# Patient Record
Sex: Female | Born: 1995 | Race: Black or African American | Hispanic: No | Marital: Single | Smoking: Never smoker
Health system: Southern US, Community
[De-identification: ages and names within clinical notes are randomized; demographics above are authoritative.]

## PROBLEM LIST (undated history)

## (undated) DIAGNOSIS — A599 Trichomoniasis, unspecified: Secondary | ICD-10-CM

## (undated) DIAGNOSIS — F419 Anxiety disorder, unspecified: Secondary | ICD-10-CM

## (undated) DIAGNOSIS — F32A Depression, unspecified: Secondary | ICD-10-CM

## (undated) HISTORY — PX: DILATION AND CURETTAGE OF UTERUS: SHX78

## (undated) HISTORY — PX: WISDOM TOOTH EXTRACTION: SHX21

---

## 2019-10-24 ENCOUNTER — Ambulatory Visit
Admission: EM | Admit: 2019-10-24 | Discharge: 2019-10-24 | Disposition: A | Discharge status: Home / Self Care | Payer: 59 | Attending: Physician Assistant | Admitting: Physician Assistant

## 2019-10-24 ENCOUNTER — Other Ambulatory Visit: Payer: Self-pay

## 2019-10-24 DIAGNOSIS — N760 Acute vaginitis: Secondary | ICD-10-CM

## 2019-10-24 DIAGNOSIS — B9689 Other specified bacterial agents as the cause of diseases classified elsewhere: Secondary | ICD-10-CM | POA: Diagnosis not present

## 2019-10-24 MED ORDER — FLUCONAZOLE 150 MG PO TABS: 150.0000 mg | ORAL_TABLET | Freq: Every day | ORAL | 0 refills | Status: DC

## 2019-10-24 NOTE — Discharge Instructions (Signed)
You were treated empirically for yeast. Start diflucan as directed.  Cytology sent, you will be contacted with any positive results that requires further treatment. Refrain from sexual activity and alcohol use for the next 7 days.   Female: Monitor for any worsening of symptoms, fever, abdominal pain, nausea, vomiting, to follow up for reevaluation.

## 2019-10-24 NOTE — ED Provider Notes (Signed)
[facility]    CSN: [id number] Arrival date & time: 10/24/19  1308      History   Chief Complaint Chief Complaint  Patient presents with  . Vaginal Discharge    HPI Catherine Becker is a 24 y.o. female.   24 year old female comes in for 2 day history of vaginal discharge. States discharge is white and thick without odor. Denies vaginal itching. Denies frequency, dysuria, hematuria. Denies abdominal pain, nausea, vomiting. Denies fever, chills, flank/back pain. Denies changes in hygiene product. LMP 10/06/2019. Sexually active, 2 female partner, no condom use. No birth control use.      History reviewed. No pertinent past medical history.  There are no problems to display for this patient.   History reviewed. No pertinent surgical history.  OB History   No obstetric history on file.      Home Medications    Prior to Admission medications   Medication Sig Start Date End Date Taking? Authorizing Provider  fluconazole (DIFLUCAN) 150 MG tablet Take 1 tablet (150 mg total) by mouth daily. Take second dose 72 hours later if symptoms still persists. 10/24/19   Ok Edwards, PA-C    Family History History reviewed. No pertinent family history.  Social History Social History   Tobacco Use  . Smoking status: Never Smoker  . Smokeless tobacco: Never Used  Substance Use Topics  . Alcohol use: Not on file  . Drug use: Never     Allergies   Patient has no known allergies.   Review of Systems Review of Systems  Reason unable to perform ROS: See HPI as above.     Physical Exam Triage Vital Signs ED Triage Vitals  Enc Vitals Group     BP 10/24/19 0851 119/76     Pulse Rate 10/24/19 0851 74     Resp 10/24/19 0851 16     Temp 10/24/19 0851 98.5 F (36.9 C)     Temp Source 10/24/19 0851 Oral     SpO2 10/24/19 0851 98 %     Weight --      Height --      Head Circumference --      Peak Flow --      Pain Score 10/24/19 0852 2     Pain Loc --      Pain  Edu? --      Excl. in [place]? --    No data found.  Updated Vital Signs BP 119/76 (BP Location: Left Arm)   Pulse 74   Temp 98.5 F (36.9 C) (Oral)   Resp 16   LMP 10/06/2019 (Exact Date)   SpO2 98%   Physical Exam Exam conducted with a chaperone present.  Constitutional:      General: She is not in acute distress.    Appearance: She is well-developed. She is not ill-appearing, toxic-appearing or diaphoretic.  HENT:     Head: Normocephalic and atraumatic.  Eyes:     Conjunctiva/sclera: Conjunctivae normal.     Pupils: Pupils are equal, round, and reactive to light.  Cardiovascular:     Rate and Rhythm: Normal rate and regular rhythm.  Pulmonary:     Effort: Pulmonary effort is normal. No respiratory distress.     Comments: LCTAB Abdominal:     General: Bowel sounds are normal.     Palpations: Abdomen is soft.     Tenderness: There is no abdominal tenderness. There is no right CVA tenderness, left CVA tenderness, guarding or rebound.  Genitourinary:    Labia:        Right: No rash or tenderness.        Left: No rash or tenderness.      Comments: White "cottage cheese" like discharge throughout vaginal canal. No obvious discharge to the cervix. Cervix without erythema, bleeding.  Musculoskeletal:     Cervical back: Normal range of motion and neck supple.  Skin:    General: Skin is warm and dry.  Neurological:     Mental Status: She is alert and oriented to person, place, and time.  Psychiatric:        Behavior: Behavior normal.        Judgment: Judgment normal.      UC Treatments / Results  Labs (all labs ordered are listed, but only abnormal results are displayed) Labs Reviewed  CERVICOVAGINAL ANCILLARY ONLY    EKG   Radiology No results found.  Procedures Procedures (including critical care time)  Medications Ordered in UC Medications - No data to display  Initial Impression / Assessment and Plan / UC Course  I have reviewed the triage vital signs  and the nursing notes.  Pertinent labs & imaging results that were available during my care of the patient were reviewed by me and considered in my medical decision making (see chart for details).    Patient was treated empirically for yeast based on appearance of discharge on exam. Diflucan as directed. Cytology sent, patient will be contacted with any positive results that require additional treatment. Patient to refrain from sexual activity for the next 7 days. Return precautions given.   Final Clinical Impressions(s) / UC Diagnoses   Final diagnoses:  Acute vaginitis    ED Prescriptions    Medication Sig Dispense Auth. Provider   fluconazole (DIFLUCAN) 150 MG tablet Take 1 tablet (150 mg total) by mouth daily. Take second dose 72 hours later if symptoms still persists. 2 tablet Belinda Fisher, PA-C     PDMP not reviewed this encounter.   Belinda Fisher, PA-C 10/24/19 [phone]

## 2019-10-24 NOTE — ED Triage Notes (Addendum)
Patient presents with vaginal discharge that she noticed x 2 days ago. She reports a thick, white discharge without vaginal itching or burning with urination. Patient denies abdominal pain. She denies taking BC.

## 2019-10-27 ENCOUNTER — Other Ambulatory Visit: Payer: Self-pay

## 2019-10-27 ENCOUNTER — Ambulatory Visit: Payer: 59 | Attending: Internal Medicine

## 2019-10-27 ENCOUNTER — Telehealth (HOSPITAL_COMMUNITY): Payer: Self-pay | Admitting: Emergency Medicine

## 2019-10-27 DIAGNOSIS — Z20822 Contact with and (suspected) exposure to covid-19: Secondary | ICD-10-CM

## 2019-10-27 LAB — CERVICOVAGINAL ANCILLARY ONLY
Chlamydia: NEGATIVE
Neisseria Gonorrhea: NEGATIVE
Trichomonas: POSITIVE — AB

## 2019-10-27 MED ORDER — METRONIDAZOLE 500 MG PO TABS: 2000.0000 mg | ORAL_TABLET | Freq: Once | ORAL | 0 refills | Status: AC

## 2019-10-27 NOTE — Telephone Encounter (Signed)
Trichomonas is positive. Rx  for Flagyl 2 grams, once was sent to the pharmacy of record. Pt needs education to refrain from sexual intercourse for 7 days to give the medicine time to work. Sexual partners need to be notified and tested/treated. Condoms may reduce risk of reinfection. Recheck for further evaluation if symptoms are not improving.   Patient contacted by phone and made aware of    results. Pt verbalized understanding and had all questions answered.    

## 2019-10-28 LAB — NOVEL CORONAVIRUS, NAA: SARS-CoV-2, NAA: NOT DETECTED

## 2019-10-30 ENCOUNTER — Ambulatory Visit
Admission: EM | Admit: 2019-10-30 | Discharge: 2019-10-30 | Disposition: A | Discharge status: Home / Self Care | Payer: 59 | Attending: Emergency Medicine | Admitting: Emergency Medicine

## 2019-10-30 DIAGNOSIS — Z113 Encounter for screening for infections with a predominantly sexual mode of transmission: Secondary | ICD-10-CM

## 2019-10-30 DIAGNOSIS — Z114 Encounter for screening for human immunodeficiency virus [HIV]: Secondary | ICD-10-CM

## 2019-10-30 DIAGNOSIS — Z7251 High risk heterosexual behavior: Secondary | ICD-10-CM

## 2019-10-30 HISTORY — DX: Trichomoniasis, unspecified: A59.9

## 2019-10-30 NOTE — ED Provider Notes (Signed)
[facility]    CSN: [id number] Arrival date & time: 10/30/19  1148      History   Chief Complaint Chief Complaint  Patient presents with  . SEXUALLY TRANSMITTED DISEASE    HPI Catherine Becker is a 24 y.o. female presenting for "blood STD testing ".  Patient seen on 10/24/2019: Given Diflucan for suspected yeast infection, underwent STD testing for G/C, trichomonas.  Patient was positive for trichomonas: States she took all 4 tabs of Flagyl on Tuesday without emesis or adverse reaction.  Patient denies history of HIV, syphilis, herpes: Denies known contact thereof.  Has been abstinent since previous evaluation.   Past Medical History:  Diagnosis Date  . Trichimoniasis     There are no problems to display for this patient.   History reviewed. No pertinent surgical history.  OB History   No obstetric history on file.      Home Medications    Prior to Admission medications   Not on File    Family History History reviewed. No pertinent family history.  Social History Social History   Tobacco Use  . Smoking status: Never Smoker  . Smokeless tobacco: Never Used  Substance Use Topics  . Alcohol use: Not Currently  . Drug use: Never     Allergies   Patient has no known allergies.   Review of Systems As per HPI   Physical Exam Triage Vital Signs ED Triage Vitals  Enc Vitals Group     BP 10/30/19 1154 117/80     Pulse Rate 10/30/19 1154 76     Resp 10/30/19 1154 16     Temp 10/30/19 1154 98.7 F (37.1 C)     Temp Source 10/30/19 1154 Oral     SpO2 10/30/19 1154 98 %     Weight --      Height --      Head Circumference --      Peak Flow --      Pain Score 10/30/19 1158 0     Pain Loc --      Pain Edu? --      Excl. in [place]? --    No data found.  Updated Vital Signs BP 117/80 (BP Location: Left Arm)   Pulse 76   Temp 98.7 F (37.1 C) (Oral)   Resp 16   LMP 10/06/2019 (Exact Date)   SpO2 98%   Visual Acuity Right Eye Distance:    Left Eye Distance:   Bilateral Distance:    Right Eye Near:   Left Eye Near:    Bilateral Near:     Physical Exam Constitutional:      General: She is not in acute distress. HENT:     Head: Normocephalic and atraumatic.  Eyes:     General: No scleral icterus.    Pupils: Pupils are equal, round, and reactive to light.  Cardiovascular:     Rate and Rhythm: Normal rate.  Pulmonary:     Effort: Pulmonary effort is normal.  Skin:    Coloration: Skin is not jaundiced or pale.  Neurological:     Mental Status: She is alert and oriented to person, place, and time.      UC Treatments / Results  Labs (all labs ordered are listed, but only abnormal results are displayed) Labs Reviewed  HIV ANTIBODY (ROUTINE TESTING W REFLEX)  RPR    EKG   Radiology No results found.  Procedures Procedures (including critical care time)  Medications Ordered in  UC Medications - No data to display  Initial Impression / Assessment and Plan / UC Course  I have reviewed the triage vital signs and the nursing notes.  Pertinent labs & imaging results that were available during my care of the patient were reviewed by me and considered in my medical decision making (see chart for details).     Patient appears well in office today: Reviewed that confirmatory STD testing contraindicated prior to 6 months from treatment.  Assuming trichomonas is cured as she tolerated Flagyl well.  HIV, RPR pending: We will call if positive, refer to ID if HIV is positive, otherwise treat for acute syphilis if positive.  Return precautions discussed, patient verbalized understanding and is agreeable to plan. Final Clinical Impressions(s) / UC Diagnoses   Final diagnoses:  Screening for human immunodeficiency virus  Screening examination for venereal disease  Unprotected sex     Discharge Instructions     HIV & syphilis tests are pending: please look for these results on the MyChart app/website.  We will  notify you if you are positive and outline treatment at that time.  Important to avoid all forms of sexual intercourse (oral, vaginal, anal) with any/all partners for the next 7 days to avoid spreading/reinfecting. Any/all sexual partners should be notified of testing/treatment today.  Return for persistent/worsening symptoms or if you develop fever, abdominal or pelvic pain, blood in your urine, or are re-exposed to an STI.    ED Prescriptions    None     PDMP not reviewed this encounter.   Hall[place], [place], [place] 10/30/19 1238

## 2019-10-30 NOTE — ED Triage Notes (Signed)
Pt states tested for positive trich 2 days ago. Pt requesting STD blood testing and has bump on her rectum that she wants tested for herpes.

## 2019-10-30 NOTE — Discharge Instructions (Signed)
HIV & syphilis tests are pending: please look for these results on the MyChart app/website.  We will notify you if you are positive and outline treatment at that time.  Important to avoid all forms of sexual intercourse (oral, vaginal, anal) with any/all partners for the next 7 days to avoid spreading/reinfecting. Any/all sexual partners should be notified of testing/treatment today.  Return for persistent/worsening symptoms or if you develop fever, abdominal or pelvic pain, blood in your urine, or are re-exposed to an STI.

## 2019-10-31 LAB — HIV ANTIBODY (ROUTINE TESTING W REFLEX): HIV Screen 4th Generation wRfx: NONREACTIVE

## 2019-10-31 LAB — RPR: RPR Ser Ql: NONREACTIVE

## 2019-11-05 ENCOUNTER — Other Ambulatory Visit: Payer: Self-pay

## 2019-11-05 ENCOUNTER — Ambulatory Visit
Admission: EM | Admit: 2019-11-05 | Discharge: 2019-11-05 | Disposition: A | Discharge status: Home / Self Care | Payer: 59

## 2019-11-05 ENCOUNTER — Encounter: Payer: Self-pay | Admitting: Emergency Medicine

## 2019-11-05 DIAGNOSIS — N9089 Other specified noninflammatory disorders of vulva and perineum: Secondary | ICD-10-CM

## 2019-11-05 NOTE — Discharge Instructions (Addendum)
Keep area clean & dry. May try warm bath, Tylenol, ibuprofen for additional symptom relief. Return for pain, discharge, belly/back pain, fever.

## 2019-11-05 NOTE — ED Provider Notes (Signed)
[facility]    CSN: [id number] Arrival date & time: 11/05/19  0809      History   Chief Complaint Chief Complaint  Patient presents with  . Vaginal discomfort    HPI Catherine Becker is a 24 y.o. female presenting for feeling of clitoral swelling, tingling/irritation.  Patient denies urinary symptoms, vaginal discharge.  Underwent treating for trichomonas 10/27/2019: Reporting resolution of vaginal discharge.  No fever, abdominal or back pain.  No change in feminine products.  LMP 11/01/2019.  Has not tried thing for symptoms.    Past Medical History:  Diagnosis Date  . Trichimoniasis     There are no problems to display for this patient.   History reviewed. No pertinent surgical history.  OB History   No obstetric history on file.      Home Medications    Prior to Admission medications   Not on File    Family History History reviewed. No pertinent family history.  Social History Social History   Tobacco Use  . Smoking status: Never Smoker  . Smokeless tobacco: Never Used  Substance Use Topics  . Alcohol use: Not Currently  . Drug use: Never     Allergies   Patient has no known allergies.   Review of Systems As per HPI   Physical Exam Triage Vital Signs ED Triage Vitals  Enc Vitals Group     BP      Pulse      Resp      Temp      Temp src      SpO2      Weight      Height      Head Circumference      Peak Flow      Pain Score      Pain Loc      Pain Edu?      Excl. in [place]?    No data found.  Updated Vital Signs BP 114/65 (BP Location: Left Arm)   Pulse 88   Temp 99.4 F (37.4 C) (Temporal)   Resp 16   LMP 11/01/2019   SpO2 98%   Visual Acuity Right Eye Distance:   Left Eye Distance:   Bilateral Distance:    Right Eye Near:   Left Eye Near:    Bilateral Near:     Physical Exam Constitutional:      General: She is not in acute distress. HENT:     Head: Normocephalic and atraumatic.  Eyes:     General: No  scleral icterus.    Pupils: Pupils are equal, round, and reactive to light.  Cardiovascular:     Rate and Rhythm: Normal rate.  Pulmonary:     Effort: Pulmonary effort is normal.  Abdominal:     General: Bowel sounds are normal.     Palpations: Abdomen is soft.     Tenderness: There is no abdominal tenderness. There is no right CVA tenderness, left CVA tenderness or guarding.  Genitourinary:    Comments: Vulva unremarkable without discharge, malodor or lesions.  No apparent clitoral hood or clitoral swelling.  No lesions, erythema, warmth or TTP. Skin:    Coloration: Skin is not jaundiced or pale.  Neurological:     Mental Status: She is alert and oriented to person, place, and time.      UC Treatments / Results  Labs (all labs ordered are listed, but only abnormal results are displayed) Labs Reviewed - No data to display  EKG   Radiology No results found.  Procedures Procedures (including critical care time)  Medications Ordered in UC Medications - No data to display  Initial Impression / Assessment and Plan / UC Course  I have reviewed the triage vital signs and the nursing notes.  Pertinent labs & imaging results that were available during my care of the patient were reviewed by me and considered in my medical decision making (see chart for details).     Patient afebrile, nontoxic.  Exam unremarkable.  Reviewed supportive care in office, to which patient verbalized understanding.  Return precautions discussed, patient verbalized understanding and is agreeable to plan. Final Clinical Impressions(s) / UC Diagnoses   Final diagnoses:  Clitoral irritation     Discharge Instructions     Keep area clean & dry. Continue 7 day abstinence for recent STD treatment. Return for pain, discharge, belly/back pain, fever.    ED Prescriptions    None     PDMP not reviewed this encounter.   Hall-Potvin, [place], [place] 11/05/19 1010

## 2019-11-05 NOTE — ED Triage Notes (Signed)
Pt presents to [facility] Becker for assessment after being diagnosed with Trich last month.  States she noted last night her clitoris was swollen and she is having a "tingling" sensation.  Patient states she has not been with anyone since her last test.  Wants the area to be looked at.

## 2019-11-06 ENCOUNTER — Ambulatory Visit
Admission: EM | Admit: 2019-11-06 | Discharge: 2019-11-06 | Disposition: A | Discharge status: Home / Self Care | Payer: 59 | Attending: Physician Assistant | Admitting: Physician Assistant

## 2019-11-06 ENCOUNTER — Other Ambulatory Visit: Payer: Self-pay

## 2019-11-06 ENCOUNTER — Encounter: Payer: Self-pay | Admitting: Emergency Medicine

## 2019-11-06 DIAGNOSIS — N898 Other specified noninflammatory disorders of vagina: Secondary | ICD-10-CM | POA: Insufficient documentation

## 2019-11-06 NOTE — ED Notes (Signed)
Patient able to ambulate independently  

## 2019-11-06 NOTE — Discharge Instructions (Addendum)
No alarming signs on exam. Cytology sent, you will be contacted with any positive results that requires further treatment. Refrain from sexual activity and alcohol use for the next 7 days. Follow up with GYN for further evaluation and management needed

## 2019-11-06 NOTE — ED Triage Notes (Addendum)
Pt presents to [facility] for assessment of white discharge and lower abdominal pain and buttock pain that began since last visit.  Patient states she has not been sexually active since she was treated for Trich in January.

## 2019-11-06 NOTE — ED Provider Notes (Signed)
[facility]    CSN: [id number] Arrival date & time: 11/06/19  1613      History   Chief Complaint Chief Complaint  Patient presents with  . Vaginal Discharge    HPI Catherine Becker is a 24 y.o. female.   24 year old female comes ins for white discharge and low abdomina/buttock pain that started since her visit yesterday with [place]. At the time, had clitoral swelling and discomfort. Denies urinary symptoms such as frequency, dysuria, hematuria. Denies fever, chills, body aches. Denies rashes, vaginal odor, vaginal itching. Denies new product changes. Has not been sexually active after treatment of trichomonas 10/2019. She recently finished her cycle, uses pads. LMP 11/01/2019     Past Medical History:  Diagnosis Date  . Trichimoniasis     There are no problems to display for this patient.   History reviewed. No pertinent surgical history.  OB History   No obstetric history on file.      Home Medications    Prior to Admission medications   Not on File    Family History Family History  Family history unknown: Yes    Social History Social History   Tobacco Use  . Smoking status: Never Smoker  . Smokeless tobacco: Never Used  Substance Use Topics  . Alcohol use: Not Currently  . Drug use: Never     Allergies   Patient has no known allergies.   Review of Systems Review of Systems  Reason unable to perform ROS: See HPI as above.     Physical Exam Triage Vital Signs ED Triage Vitals [11/06/19 1623]  Enc Vitals Group     BP 123/72     Pulse Rate 84     Resp 16     Temp 98.8 F (37.1 C)     Temp Source Oral     SpO2 99 %     Weight      Height      Head Circumference      Peak Flow      Pain Score      Pain Loc      Pain Edu?      Excl. in [place]?    No data found.  Updated Vital Signs BP 123/72 (BP Location: Left Arm)   Pulse 84   Temp 98.8 F (37.1 C) (Oral)   Resp 16   LMP 11/01/2019   SpO2 99%   Physical Exam Exam  conducted with a chaperone present.  Constitutional:      General: She is not in acute distress.    Appearance: She is well-developed. She is not ill-appearing, toxic-appearing or diaphoretic.  HENT:     Head: Normocephalic and atraumatic.  Eyes:     Conjunctiva/sclera: Conjunctivae normal.     Pupils: Pupils are equal, round, and reactive to light.  Cardiovascular:     Rate and Rhythm: Normal rate and regular rhythm.  Pulmonary:     Effort: Pulmonary effort is normal. No respiratory distress.     Comments: LCTAB Abdominal:     General: Bowel sounds are normal.     Palpations: Abdomen is soft.     Tenderness: There is no abdominal tenderness. There is no right CVA tenderness, left CVA tenderness, guarding or rebound.  Genitourinary:    Vagina: Vaginal discharge present. No erythema, tenderness or bleeding.     Cervix: No cervical motion tenderness, discharge, friability or erythema.     Comments: No rash, swelling, erythema, warmth to the  labia, buttock, rectum. No tenderness to palpation.  Musculoskeletal:     Cervical back: Normal range of motion and neck supple.  Skin:    General: Skin is warm and dry.  Neurological:     Mental Status: She is alert and oriented to person, place, and time.  Psychiatric:        Behavior: Behavior normal.        Judgment: Judgment normal.      UC Treatments / Results  Labs (all labs ordered are listed, but only abnormal results are displayed) Labs Reviewed  CERVICOVAGINAL ANCILLARY ONLY    EKG   Radiology No results found.  Procedures Procedures (including critical care time)  Medications Ordered in UC Medications - No data to display  Initial Impression / Assessment and Plan / UC Course  I have reviewed the triage vital signs and the nursing notes.  Pertinent labs & imaging results that were available during my care of the patient were reviewed by me and considered in my medical decision making (see chart for details).      Cytology sent, patient will be contacted with any positive results that require additional treatment. Patient to refrain from sexual activity for the next 7 days. Given patient has had different vaginal concerns in the past 1-2 months, discussed may benefit from follow up with PCP/GYN for further evaluation as well, especially if cytology negative. Return precautions given.   Final Clinical Impressions(s) / UC Diagnoses   Final diagnoses:  Vaginal discharge   ED Prescriptions    None     PDMP not reviewed this encounter.   Belinda Fisher, PA-C 11/06/19 1719

## 2019-11-11 LAB — CERVICOVAGINAL ANCILLARY ONLY
Bacterial vaginitis: NEGATIVE
Candida vaginitis: NEGATIVE
Chlamydia: NEGATIVE
Neisseria Gonorrhea: NEGATIVE
Trichomonas: NEGATIVE

## 2019-11-30 ENCOUNTER — Ambulatory Visit
Admission: EM | Admit: 2019-11-30 | Discharge: 2019-11-30 | Disposition: A | Discharge status: Home / Self Care | Payer: 59 | Attending: Physician Assistant | Admitting: Physician Assistant

## 2019-11-30 DIAGNOSIS — Z113 Encounter for screening for infections with a predominantly sexual mode of transmission: Secondary | ICD-10-CM

## 2019-11-30 NOTE — ED Provider Notes (Signed)
[facility]    CSN: [id number] Arrival date & time: 11/30/19  1759      History   Chief Complaint Chief Complaint  Patient presents with  . SEXUALLY TRANSMITTED DISEASE    HPI Catherine Becker is a 24 y.o. female.   25 year old female comes in for HIV/RPR testing. [facility]/discharge. Referred to GYN, states has had establishment. States "just here for HIV/RPR testing". Denies new exposure. Denies new symptoms such as new discharge, itching. Denies fever, chills.      Past Medical History:  Diagnosis Date  . Trichimoniasis     There are no problems to display for this patient.   History reviewed. No pertinent surgical history.  OB History   No obstetric history on file.      Home Medications    Prior to Admission medications   Not on File    Family History Family History  Family history unknown: Yes    Social History Social History   Tobacco Use  . Smoking status: Never Smoker  . Smokeless tobacco: Never Used  Substance Use Topics  . Alcohol use: Not Currently  . Drug use: Never     Allergies   Patient has no known allergies.   Review of Systems Review of Systems  Reason unable to perform ROS: See HPI as above.     Physical Exam Triage Vital Signs ED Triage Vitals [11/30/19 1805]  Enc Vitals Group     BP 130/85     Pulse Rate 75     Resp 16     Temp 98.7 F (37.1 C)     Temp Source Oral     SpO2 99 %     Weight      Height      Head Circumference      Peak Flow      Pain Score      Pain Loc      Pain Edu?      Excl. in [place]?    No data found.  Updated Vital Signs BP 130/85 (BP Location: Left Arm)   Pulse 75   Temp 98.7 F (37.1 C) (Oral)   Resp 16   LMP 11/01/2019   SpO2 99%   Visual Acuity Right Eye Distance:   Left Eye Distance:   Bilateral Distance:    Right Eye Near:   Left Eye Near:    Bilateral Near:     Physical Exam Constitutional:       General: She is not in acute distress.    Appearance: Normal appearance. She is well-developed. She is not toxic-appearing or diaphoretic.  HENT:     Head: Normocephalic and atraumatic.  Eyes:     Conjunctiva/sclera: Conjunctivae normal.     Pupils: Pupils are equal, round, and reactive to light.  Pulmonary:     Effort: Pulmonary effort is normal. No respiratory distress.     Comments: Speaking in full sentences without difficulty Musculoskeletal:     Cervical back: Normal range of motion and neck supple.  Skin:    General: Skin is warm and dry.  Neurological:     Mental Status: She is alert and oriented to person, place, and time.      UC Treatments / Results  Labs (all labs ordered are listed, but only abnormal results are displayed) Labs Reviewed  HIV ANTIBODY (ROUTINE TESTING W REFLEX)  RPR    EKG  Radiology No results found.  Procedures Procedures (including critical care time)  Medications Ordered in UC Medications - No data to display  Initial Impression / Assessment and Plan / UC Course  I have reviewed the triage vital signs and the nursing notes.  Pertinent labs & imaging results that were available during my care of the patient were reviewed by me and considered in my medical decision making (see chart for details).    HIV/RPR testing sent. Patient to follow up with GYN for further evaluation and monitoring needed.  Final Clinical Impressions(s) / UC Diagnoses   Final diagnoses:  Screen for STD (sexually transmitted disease)   ED Prescriptions    None     PDMP not reviewed this encounter.   Belinda Fisher, PA-C 11/30/19 1904

## 2019-11-30 NOTE — Discharge Instructions (Signed)
HIV and syphilis testing drawn. You will be contacted with any positive results. Follow up with GYN for further testing and monitoring needed.

## 2019-11-30 NOTE — ED Triage Notes (Signed)
Pt here for HIV syphilis testing. Denies sx's, requesting testing

## 2019-12-02 LAB — HIV ANTIBODY (ROUTINE TESTING W REFLEX): HIV Screen 4th Generation wRfx: NONREACTIVE

## 2019-12-02 LAB — RPR: RPR Ser Ql: NONREACTIVE

## 2020-02-23 ENCOUNTER — Other Ambulatory Visit: Payer: Self-pay

## 2020-02-23 ENCOUNTER — Encounter (HOSPITAL_COMMUNITY): Payer: Self-pay

## 2020-02-23 ENCOUNTER — Emergency Department (HOSPITAL_COMMUNITY): Payer: 59

## 2020-02-23 ENCOUNTER — Emergency Department (HOSPITAL_COMMUNITY)
Admission: EM | Admit: 2020-02-23 | Discharge: 2020-02-23 | Disposition: A | Discharge status: Home / Self Care | Payer: 59 | Attending: Emergency Medicine | Admitting: Emergency Medicine

## 2020-02-23 DIAGNOSIS — R102 Pelvic and perineal pain: Secondary | ICD-10-CM | POA: Diagnosis present

## 2020-02-23 DIAGNOSIS — Z202 Contact with and (suspected) exposure to infections with a predominantly sexual mode of transmission: Secondary | ICD-10-CM | POA: Diagnosis not present

## 2020-02-23 DIAGNOSIS — K1370 Unspecified lesions of oral mucosa: Secondary | ICD-10-CM | POA: Insufficient documentation

## 2020-02-23 DIAGNOSIS — R1310 Dysphagia, unspecified: Secondary | ICD-10-CM | POA: Diagnosis not present

## 2020-02-23 LAB — URINALYSIS, ROUTINE W REFLEX MICROSCOPIC
Bilirubin Urine: NEGATIVE
Glucose, UA: NEGATIVE mg/dL
Ketones, ur: NEGATIVE mg/dL
Leukocytes,Ua: NEGATIVE
Nitrite: NEGATIVE
Protein, ur: NEGATIVE mg/dL
Specific Gravity, Urine: 1.017 (ref 1.005–1.030)
pH: 5 (ref 5.0–8.0)

## 2020-02-23 LAB — COMPREHENSIVE METABOLIC PANEL
ALT: 16 U/L (ref 0–44)
AST: 16 U/L (ref 15–41)
Albumin: 4.5 g/dL (ref 3.5–5.0)
Alkaline Phosphatase: 78 U/L (ref 38–126)
Anion gap: 10 (ref 5–15)
BUN: 13 mg/dL (ref 6–20)
CO2: 22 mmol/L (ref 22–32)
Calcium: 9.4 mg/dL (ref 8.9–10.3)
Chloride: 106 mmol/L (ref 98–111)
Creatinine, Ser: 0.69 mg/dL (ref 0.44–1.00)
GFR calc Af Amer: >60 mL/min (ref >60)
GFR calc non Af Amer: >60 mL/min (ref >60)
Glucose, Bld: 89 mg/dL (ref 70–99)
Potassium: 3.5 mmol/L (ref 3.5–5.1)
Sodium: 138 mmol/L (ref 135–145)
Total Bilirubin: 0.6 mg/dL (ref 0.3–1.2)
Total Protein: 7.7 g/dL (ref 6.5–8.1)

## 2020-02-23 LAB — I-STAT BETA HCG BLOOD, ED (MC, WL, AP ONLY): I-stat hCG, quantitative: <5 m[IU]/mL (ref <5)

## 2020-02-23 LAB — CBC
HCT: 42 % (ref 36.0–46.0)
Hemoglobin: 14.2 g/dL (ref 12.0–15.0)
MCH: 31.9 pg (ref 26.0–34.0)
MCHC: 33.8 g/dL (ref 30.0–36.0)
MCV: 94.4 fL (ref 80.0–100.0)
Platelets: 374 10*3/uL (ref 150–400)
RBC: 4.45 MIL/uL (ref 3.87–5.11)
RDW: 13 % (ref 11.5–15.5)
WBC: 8.3 10*3/uL (ref 4.0–10.5)
nRBC: 0 % (ref 0.0–0.2)

## 2020-02-23 LAB — RAPID HIV SCREEN (HIV 1/2 AB+AG)
HIV 1/2 Antibodies: NONREACTIVE
HIV-1 P24 Antigen - HIV24: NONREACTIVE

## 2020-02-23 LAB — LIPASE, BLOOD: Lipase: 25 U/L (ref 11–51)

## 2020-02-23 NOTE — ED Notes (Signed)
Pt requests HIV test Pt informed the EDP would need to see her and then order said test  Pt has 4 gold tops in main lab if HIV test is ordered

## 2020-02-23 NOTE — ED Triage Notes (Signed)
Pt sts she is here for generalized abdominal, suprapubic and pelvic pain.  Vaginal discharge that is white and watery for months.

## 2020-02-23 NOTE — ED Notes (Signed)
Discussed discharge instructions. All questions addressed. Patient transported home via car by her self.

## 2020-02-23 NOTE — ED Provider Notes (Signed)
[facility] DEPT Provider Note   CSN: [id number] Arrival date & time: 02/23/20  0059     History Chief Complaint  Patient presents with  . Abdominal Pain    Catherine Becker is a 24 y.o. female.  [age]-year-old female presents with pelvic discomfort times several months.  Seen by her physician for this last week pelvic exam including cervical studies.  No etiology was found and patient was referred to GYN.  Today she notes cramping and vaginal discomfort which is been no change from her prior symptoms for several months.  No fever or chills.  No nausea or vomiting.  No urinary symptoms.  Noted some mouth ulcers and trouble swallowing.  Is concerned she may have HIV due to having unprotected sex sometime ago.        Past Medical History:  Diagnosis Date  . Trichimoniasis     There are no problems to display for this patient.   History reviewed. No pertinent surgical history.   OB History   No obstetric history on file.     Family History  Family history unknown: Yes    Social History   Tobacco Use  . Smoking status: Never Smoker  . Smokeless tobacco: Never Used  Substance Use Topics  . Alcohol use: Not Currently  . Drug use: Never    Home Medications Prior to Admission medications   Not on File    Allergies    Patient has no known allergies.  Review of Systems   Review of Systems  All other systems reviewed and are negative.   Physical Exam Updated Vital Signs BP (!) 124/93 (BP Location: Right Arm)   Pulse 88   Temp 98.6 F (37 C) (Oral)   Resp 15   Ht 1.651 m (5\' 5" )   Wt 98.9 kg   SpO2 99%   BMI 36.28 kg/m   Physical Exam Vitals and nursing note reviewed.  Constitutional:      General: She is not in acute distress.    Appearance: Normal appearance. She is well-developed. She is not toxic-appearing.  HENT:     Head: Normocephalic and atraumatic.  Eyes:     General: Lids are normal.     Conjunctiva/sclera:  Conjunctivae normal.     Pupils: Pupils are equal, round, and reactive to light.  Neck:     Thyroid: No thyroid mass.     Trachea: No tracheal deviation.  Cardiovascular:     Rate and Rhythm: Normal rate and regular rhythm.     Heart sounds: Normal heart sounds. No murmur. No gallop.   Pulmonary:     Effort: Pulmonary effort is normal. No respiratory distress.     Breath sounds: Normal breath sounds. No stridor. No decreased breath sounds, wheezing, rhonchi or rales.  Abdominal:     General: Bowel sounds are normal. There is no distension.     Palpations: Abdomen is soft.     Tenderness: There is no abdominal tenderness. There is no guarding or rebound.    Musculoskeletal:        General: No tenderness. Normal range of motion.     Cervical back: Normal range of motion and neck supple.  Skin:    General: Skin is warm and dry.     Findings: No abrasion or rash.  Neurological:     Mental Status: She is alert and oriented to person, place, and time.     GCS: GCS eye subscore is 4. GCS verbal  subscore is 5. GCS motor subscore is 6.     Cranial Nerves: No cranial nerve deficit.     Sensory: No sensory deficit.  Psychiatric:        Speech: Speech normal.        Behavior: Behavior normal.     ED Results / Procedures / Treatments   Labs (all labs ordered are listed, but only abnormal results are displayed) Labs Reviewed  URINALYSIS, ROUTINE W REFLEX MICROSCOPIC - Abnormal; Notable for the following components:      Result Value   Hgb urine dipstick SMALL (*)    Bacteria, UA RARE (*)    All other components within normal limits  LIPASE, BLOOD  COMPREHENSIVE METABOLIC PANEL  CBC  RAPID HIV SCREEN (HIV 1/2 AB+AG)  I-STAT BETA HCG BLOOD, ED ([facility], [facility], [facility] ONLY)    EKG None  Radiology No results found.  Procedures Procedures (including critical care time)  Medications Ordered in ED Medications - No data to display  ED Course  I have reviewed the triage vital signs and  the nursing notes.  Pertinent labs & imaging results that were available during my care of the patient were reviewed by me and considered in my medical decision making (see chart for details).    MDM Rules/Calculators/A&P                      Patient's pelvic ultrasound negative for acute findings.  HIV negative.  Labs are reassuring here.  Patient recently had pelvic exam with wet prep and cultures taken last week and will not repeat today.  Encouraged to keep her appointment with the gynecologist that she has been referred to Final Clinical Impression(s) / ED Diagnoses Final diagnoses:  None    Rx / DC Orders ED Discharge Orders    None       Lorre Nick, MD 02/23/20 [phone]

## 2020-02-23 NOTE — Discharge Instructions (Addendum)
Your pelvic ultrasound HIV test both today were normal.  Keep your appointment with the gynecologist as you have been instructed

## 2020-02-23 NOTE — ED Notes (Signed)
US at bedside

## 2020-02-28 ENCOUNTER — Other Ambulatory Visit: Payer: Self-pay

## 2020-02-28 ENCOUNTER — Ambulatory Visit
Admission: EM | Admit: 2020-02-28 | Discharge: 2020-02-28 | Disposition: A | Discharge status: Home / Self Care | Payer: 59 | Attending: Physician Assistant | Admitting: Physician Assistant

## 2020-02-28 DIAGNOSIS — N898 Other specified noninflammatory disorders of vagina: Secondary | ICD-10-CM | POA: Diagnosis present

## 2020-02-28 MED ORDER — CLOTRIMAZOLE 2 % VA CREA: 1.0000 | TOPICAL_CREAM | Freq: Every day | VAGINAL | 0 refills | Status: DC

## 2020-02-28 MED ORDER — CHLORHEXIDINE GLUCONATE 0.12 % MT SOLN: 15.0000 mL | Freq: Two times a day (BID) | OROMUCOSAL | 0 refills | Status: DC

## 2020-02-28 NOTE — Discharge Instructions (Addendum)
You were treated empirically for yeast.  Cytology sent, you will be contacted with any positive results that requires further treatment. Refrain from sexual activity and alcohol use for the next 7 days. Monitor for any worsening of symptoms, fever, abdominal pain, nausea, vomiting, to follow up for reevaluation.   Peridex for mouth sores. Follow up with dentist if symptoms persisting.

## 2020-02-28 NOTE — ED Provider Notes (Signed)
[facility]    CSN: [id number] Arrival date & time: 02/28/20  1551      History   Chief Complaint Chief Complaint  Patient presents with  . Vaginal Discharge  . Mouth Lesions    HPI Catherine Becker is a 24 y.o. female.   24 year old female comes in for few day history of vaginal discharge, itching. States area is erythematous. Denies abdominal symptoms, urinary symptoms, fever. Not currently sexually active, last sexual activity few months ago. LMP 02/10/2020  States has been getting mouth sores for the past few months. Current symptoms 1 week ago. No URI symptoms. No dental pain.      Past Medical History:  Diagnosis Date  . Trichimoniasis     There are no problems to display for this patient.   History reviewed. No pertinent surgical history.  OB History   No obstetric history on file.      Home Medications    Prior to Admission medications   Medication Sig Start Date End Date Taking? Authorizing Provider  chlorhexidine (PERIDEX) 0.12 % solution Use as directed 15 mLs in the mouth or throat 2 (two) times daily. 02/28/20   Tasia Catchings, Juandedios Dudash V, PA-C  clotrimazole (GYNE-LOTRIMIN 3) 2 % vaginal cream Place 1 Applicatorful vaginally at bedtime. 02/28/20   Ok Edwards, PA-C    Family History Family History  Problem Relation Age of Onset  . Healthy Mother   . Healthy Father     Social History Social History   Tobacco Use  . Smoking status: Never Smoker  . Smokeless tobacco: Never Used  Substance Use Topics  . Alcohol use: Not Currently  . Drug use: Never     Allergies   Patient has no known allergies.   Review of Systems Review of Systems  Reason unable to perform ROS: See HPI as above.     Physical Exam Triage Vital Signs ED Triage Vitals  Enc Vitals Group     BP 02/28/20 1600 111/73     Pulse Rate 02/28/20 1600 90     Resp 02/28/20 1600 16     Temp 02/28/20 1600 98.2 F (36.8 C)     Temp Source 02/28/20 1600 Oral     SpO2 02/28/20 1600  99 %     Weight --      Height --      Head Circumference --      Peak Flow --      Pain Score 02/28/20 1601 2     Pain Loc --      Pain Edu? --      Excl. in [place]? --    No data found.  Updated Vital Signs BP 111/73 (BP Location: Right Arm)   Pulse 90   Temp 98.2 F (36.8 C) (Oral)   Resp 16   LMP 02/10/2020   SpO2 99%   Physical Exam Exam conducted with a chaperone present.  Constitutional:      General: She is not in acute distress.    Appearance: Normal appearance. She is well-developed. She is not toxic-appearing or diaphoretic.  HENT:     Head: Normocephalic and atraumatic.     Mouth/Throat:     Mouth: Mucous membranes are moist.     Pharynx: Oropharynx is clear. Uvula midline.     Comments: White linear abrasions to bilateral inner cheek. No ulcerations noted Eyes:     Conjunctiva/sclera: Conjunctivae normal.     Pupils: Pupils are equal, round, and  reactive to light.  Pulmonary:     Effort: Pulmonary effort is normal. No respiratory distress.     Comments: Speaking in full sentences without difficulty Genitourinary:    Comments: Milky/clumpy discharge to vaginal opening. Erythema with swelling to left vulva. No ulceration.  Musculoskeletal:     Cervical back: Normal range of motion and neck supple.  Skin:    General: Skin is warm and dry.  Neurological:     Mental Status: She is alert and oriented to person, place, and time.      UC Treatments / Results  Labs (all labs ordered are listed, but only abnormal results are displayed) Labs Reviewed  CERVICOVAGINAL ANCILLARY ONLY    EKG   Radiology No results found.  Procedures Procedures (including critical care time)  Medications Ordered in UC Medications - No data to display  Initial Impression / Assessment and Plan / UC Course  I have reviewed the triage vital signs and the nursing notes.  Pertinent labs & imaging results that were available during my care of the patient were reviewed by me  and considered in my medical decision making (see chart for details).    1. Vaginal irritation Given discharge appearance and itching, will cover for yeast. Cytology sent. Follow up with GYN if symptoms not improving. Return precautions given.  2. Mouth sores Peridex. Discussed likely irritation from teeth given appearance. Follow with dentist if symptoms persist. Return precautions given.   Final Clinical Impressions(s) / UC Diagnoses   Final diagnoses:  Vaginal irritation    ED Prescriptions    Medication Sig Dispense Auth. Provider   clotrimazole (GYNE-LOTRIMIN 3) 2 % vaginal cream Place 1 Applicatorful vaginally at bedtime. 21 g Taniah Reinecke V, PA-C   chlorhexidine (PERIDEX) 0.12 % solution Use as directed 15 mLs in the mouth or throat 2 (two) times daily. 120 mL Belinda Fisher, PA-C     PDMP not reviewed this encounter.   Belinda Fisher, PA-C 02/28/20 [phone]

## 2020-02-28 NOTE — ED Triage Notes (Addendum)
Patient is here with complaints of vaginal discharge and pain, also with mouth lesions that occur at the same time as the  vaginal discomfort. She denies painful urination and no abdominal pain at this time. Notes this has been ongoing for months.

## 2020-03-02 LAB — CERVICOVAGINAL ANCILLARY ONLY
Bacterial Vaginitis (gardnerella): NEGATIVE
Candida Glabrata: NEGATIVE
Candida Vaginitis: NEGATIVE
Chlamydia: NEGATIVE
Comment: NEGATIVE
Comment: NEGATIVE
Comment: NEGATIVE
Comment: NEGATIVE
Comment: NEGATIVE
Comment: NORMAL
Neisseria Gonorrhea: NEGATIVE
Trichomonas: NEGATIVE

## 2020-03-30 ENCOUNTER — Ambulatory Visit (INDEPENDENT_AMBULATORY_CARE_PROVIDER_SITE_OTHER): Payer: 59 | Admitting: Internal Medicine

## 2020-03-30 ENCOUNTER — Encounter: Payer: Self-pay | Admitting: Internal Medicine

## 2020-03-30 ENCOUNTER — Other Ambulatory Visit: Payer: Self-pay

## 2020-03-30 VITALS — BP 108/74 | HR 86 | Temp 98.5°F | Ht 65.0 in | Wt 230.4 lb

## 2020-03-30 DIAGNOSIS — B373 Candidiasis of vulva and vagina: Secondary | ICD-10-CM | POA: Diagnosis not present

## 2020-03-30 DIAGNOSIS — Z9189 Other specified personal risk factors, not elsewhere classified: Secondary | ICD-10-CM | POA: Diagnosis not present

## 2020-03-30 DIAGNOSIS — K120 Recurrent oral aphthae: Secondary | ICD-10-CM

## 2020-03-30 DIAGNOSIS — B3731 Acute candidiasis of vulva and vagina: Secondary | ICD-10-CM

## 2020-03-30 NOTE — Patient Instructions (Signed)
Yeast infection resolved currently=consider doing longer course of treatment at next episode. Recommend fluconazole 150mg  daily x 3-5 days  Aphthous ulcers vs. hsv = stress induced likely. Could do a trial on valtrex 1gm BID x 7 days  to see if it improves

## 2020-03-30 NOTE — Progress Notes (Signed)
RFV: vaginitis referral  Patient ID: Catherine Becker, female   DOB: [birth year], 24 y.o.   MRN: [id number]  HPI  Unprotected sex in January then started having ,yeast infection recurrence. One occurrence for 2 1/2 weeks. Went to do OB/Gyn and pcp multiple times for evaluate. Multiple times had STI checks. She has a main partner, but her boyfriend live in [place]. In January, was treated with trichomonas. Her current partner, also been tested.  Last time on fluconazole - 2 months ago.  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 No hx of vaginal lesions  Most concerned about vaginal pain.   ROS= + weight loss.    Outpatient Encounter Medications as of 03/30/2020  Medication Sig  . chlorhexidine (PERIDEX) 0.12 % solution Use as directed 15 mLs in the mouth or throat 2 (two) times daily.  . clotrimazole (GYNE-LOTRIMIN 3) 2 % vaginal cream Place 1 Applicatorful vaginally at bedtime. (Patient taking differently: Place 1 Applicatorful vaginally as needed. )  . lidocaine (XYLOCAINE) 2 % solution SMARTSIG:Sparingly By Mouth 4 Times Daily PRN   No facility-administered encounter medications on file as of 03/30/2020.     There are no problems to display for this patient.    Health Maintenance Due  Topic Date Due  . Hepatitis C Screening  Never done  . COVID-19 Vaccine (1) Never done  . TETANUS/TDAP  Never done  . PAP-Cervical Cytology Screening  Never done  . PAP SMEAR-Modifier  Never done    Social History   Tobacco Use  . Smoking status: Never Smoker  . Smokeless tobacco: Never Used  Vaping Use  . Vaping Use: Never used  Substance Use Topics  . Alcohol use: Yes    Alcohol/week: 1.0 standard drink    Types: 1 Glasses of wine per week    Comment: Occassionally  . Drug use: Never   family history includes Healthy in  her father and mother. Review of Systems 12 point ros is negative except what is mentioned above Physical Exam   BP 108/74   Pulse 86   Temp 98.5 F (36.9 C)   Ht 5\' 5"  (1.651 m)   Wt 230 lb 6.4 oz (104.5 kg)   SpO2 100%   BMI 38.34 kg/m   gen = a x o by 3 in nad heent = OP clear, MMM Neck = no cervical LN Skin = no rash  Lab Results  Component Value Date   LABRPR Non Reactive 11/30/2019    CBC Lab Results  Component Value Date   WBC 8.3 02/23/2020   RBC 4.45 02/23/2020   HGB 14.2 02/23/2020   HCT 42.0 02/23/2020   PLT 374 02/23/2020   MCV 94.4 02/23/2020   MCH 31.9 02/23/2020   MCHC 33.8 02/23/2020   RDW 13.0 02/23/2020    BMET Lab Results  Component Value Date   NA 138 02/23/2020   K 3.5 02/23/2020   CL 106 02/23/2020   CO2 22 02/23/2020   GLUCOSE 89 02/23/2020   BUN 13 02/23/2020   CREATININE 0.69 02/23/2020   CALCIUM 9.4 02/23/2020   GFRNONAA >60 02/23/2020   GFRAA >60 02/23/2020    Assessment and Plan  Concerned about hiv disease = will repeat hiv testing  Yeast infection resolved =consider doing longer course of treatment at next episode  Aphthous ulcers  vs. hsv = stress induced likely. 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

## 2020-03-31 LAB — HIV ANTIBODY (ROUTINE TESTING W REFLEX): HIV 1&2 Ab, 4th Generation: NONREACTIVE

## 2021-06-18 IMAGING — US US PELVIS COMPLETE
1 series · 14 of 25 positions shown · non-contrast
Comparison: None.

CLINICAL DATA: Pelvic pain

EXAM:
TRANSABDOMINAL AND TRANSVAGINAL ULTRASOUND OF PELVIS
DOPPLER ULTRASOUND OF OVARIES
TECHNIQUE: Both transabdominal and transvaginal ultrasound examinations of the
pelvis were performed. Transabdominal technique was performed for
global imaging of the pelvis including uterus, ovaries, adnexal
regions, and pelvic cul-de-sac.
It was necessary to proceed with endovaginal exam following the
transabdominal exam to visualize the adnexa and endometrium. Color
and duplex Doppler ultrasound was utilized to evaluate blood flow to
the ovaries.

[Series 1: us pelvis complete · 14 of 111 slices shown]
[im 1/111]
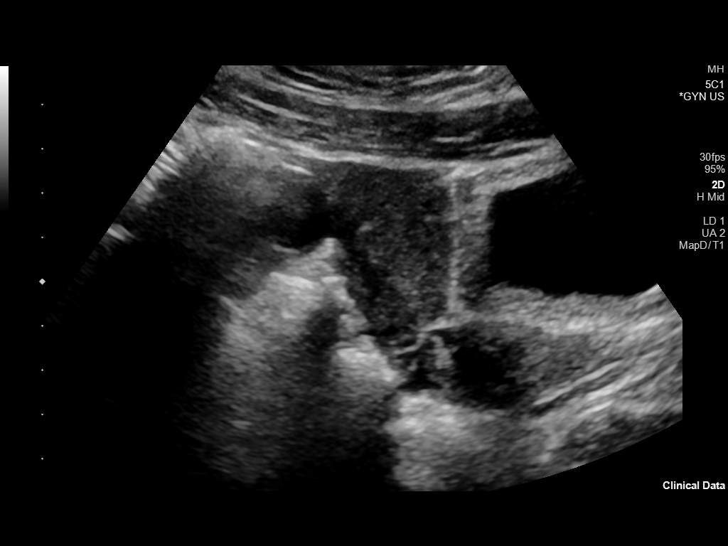
[im 10/111]
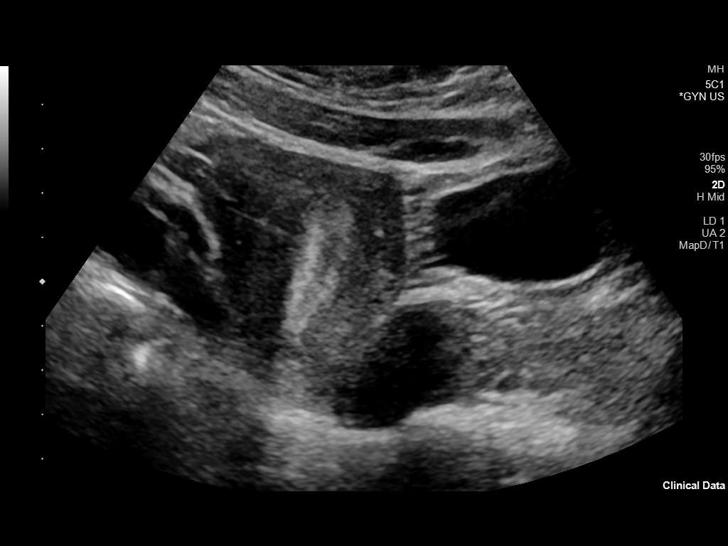
[im 19/111]
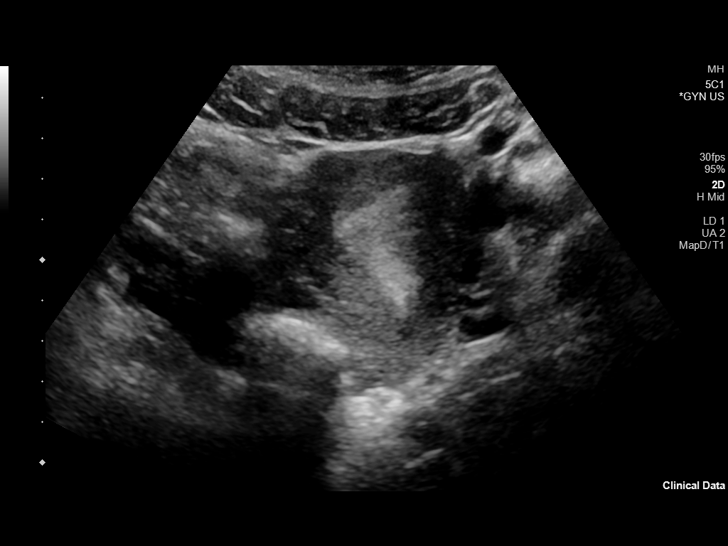
[im 28/111]
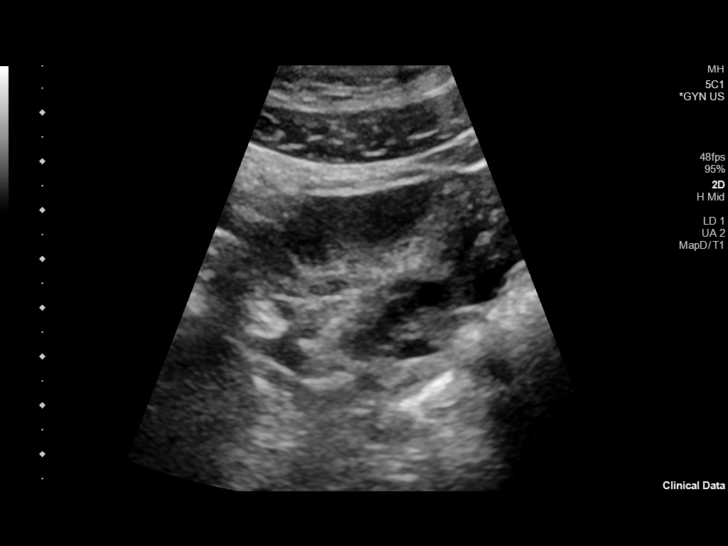
[im 37/111]
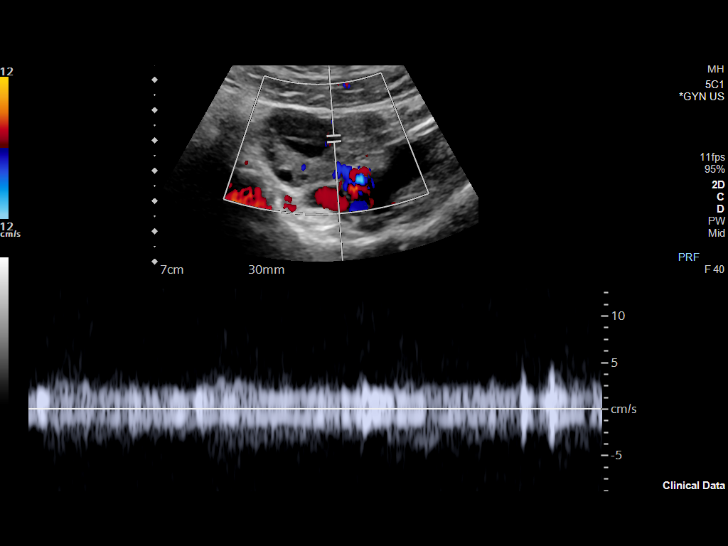
[im 42/111]
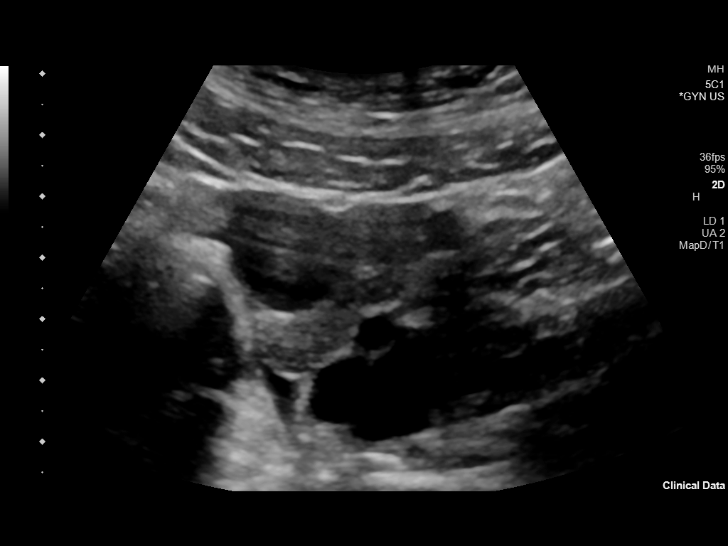
[im 51/111]
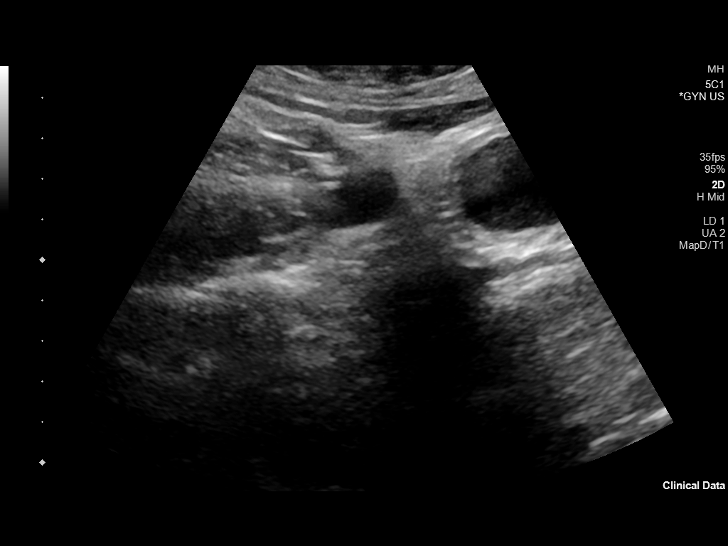
[im 60/111]
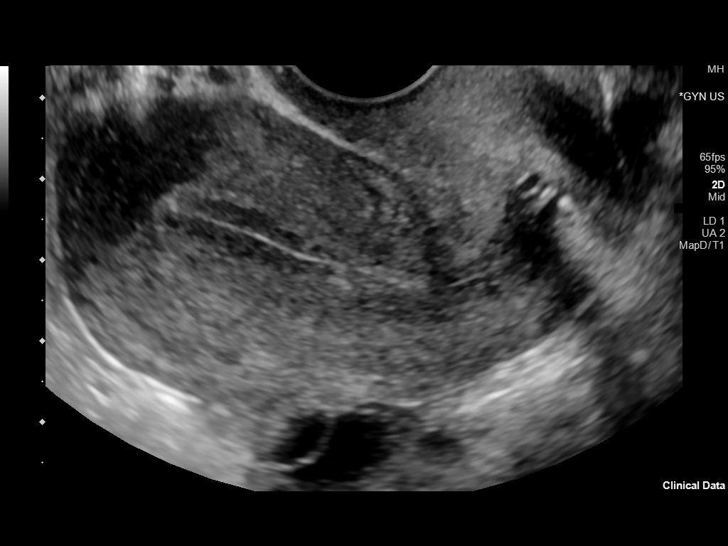
[im 69/111]
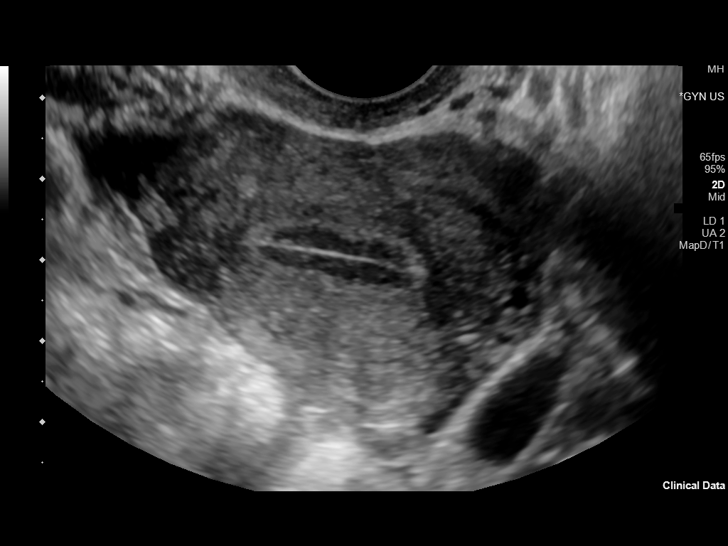
[im 74/111]
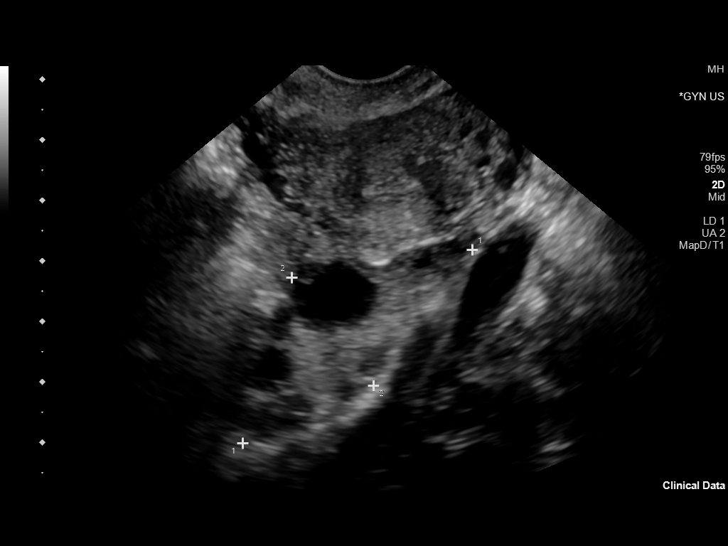
[im 83/111]
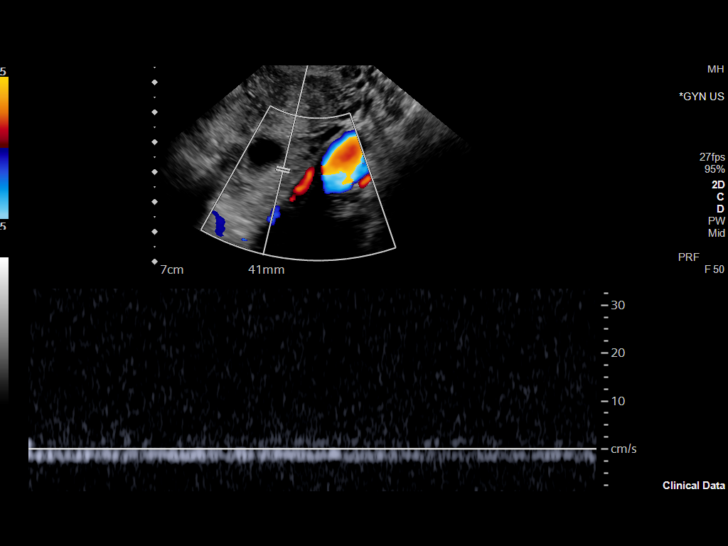
[im 92/111]
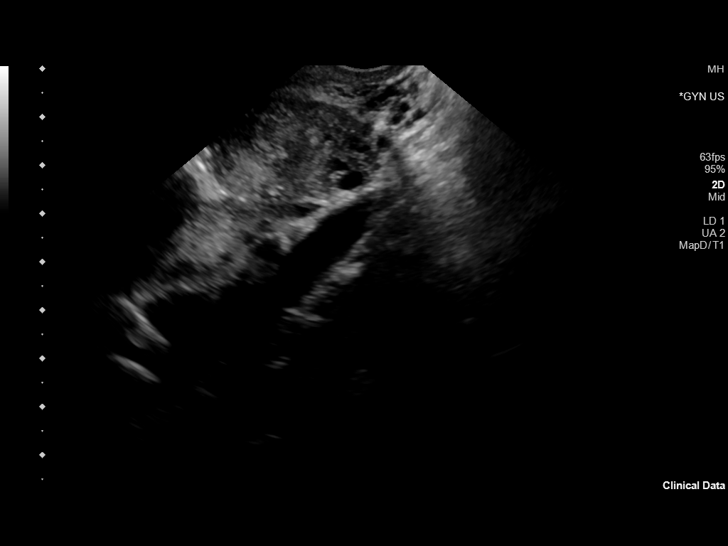
[im 101/111]
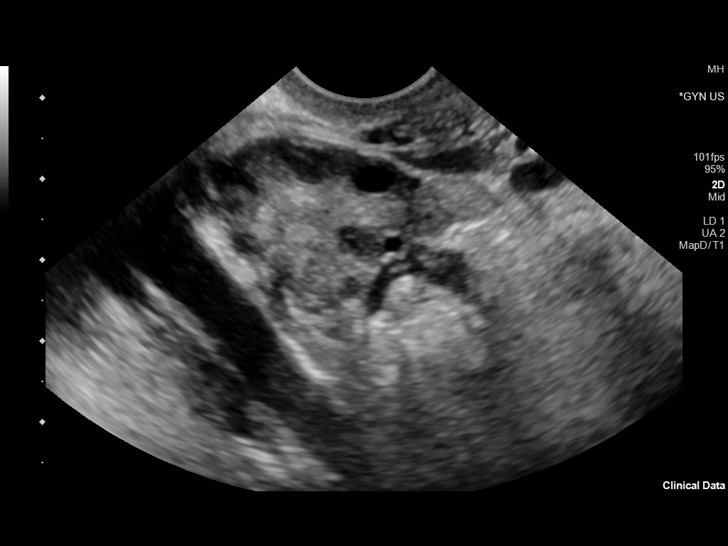
[im 111/111]
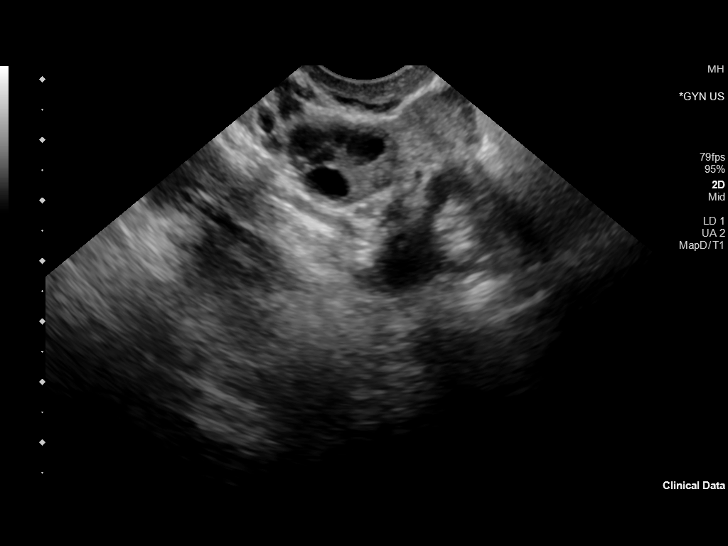

[14 of 25 positions shown; findings below may reference images not displayed]

FINDINGS: Uterus

Measurements: 7 x 4 x 5 cm = volume: 64 mL. No fibroids or other
mass visualized.

Endometrium

Thickness: 11 mm.  No focal abnormality visualized.

Right ovary

Measurements: 38 x 16 x 25 mm = volume: 8 mL. Normal appearance/no
adnexal mass.

Left ovary

Measurements: 50 x 22 x 28 mm = volume: There is 16 mL. Normal
appearance/no adnexal mass.

Pulsed Doppler evaluation of both ovaries demonstrates normal
low-resistance arterial and venous waveforms.

Other findings

No abnormal free fluid.
IMPRESSION: No acute finding.  Normal bilateral ovarian blood flow.

## 2021-06-18 IMAGING — US US ART/VEN ABD/PELV/SCROTUM DOPPLER LTD
1 series · 13 of 25 positions shown · non-contrast
Comparison: None.

CLINICAL DATA: Pelvic pain

EXAM:
TRANSABDOMINAL AND TRANSVAGINAL ULTRASOUND OF PELVIS
DOPPLER ULTRASOUND OF OVARIES
TECHNIQUE: Both transabdominal and transvaginal ultrasound examinations of the
pelvis were performed. Transabdominal technique was performed for
global imaging of the pelvis including uterus, ovaries, adnexal
regions, and pelvic cul-de-sac.
It was necessary to proceed with endovaginal exam following the
transabdominal exam to visualize the adnexa and endometrium. Color
and duplex Doppler ultrasound was utilized to evaluate blood flow to
the ovaries.

[Series 1: us art/ven abd/pelv/scrotum doppler ltd · 13 of 111 slices shown]
[im 1/111]
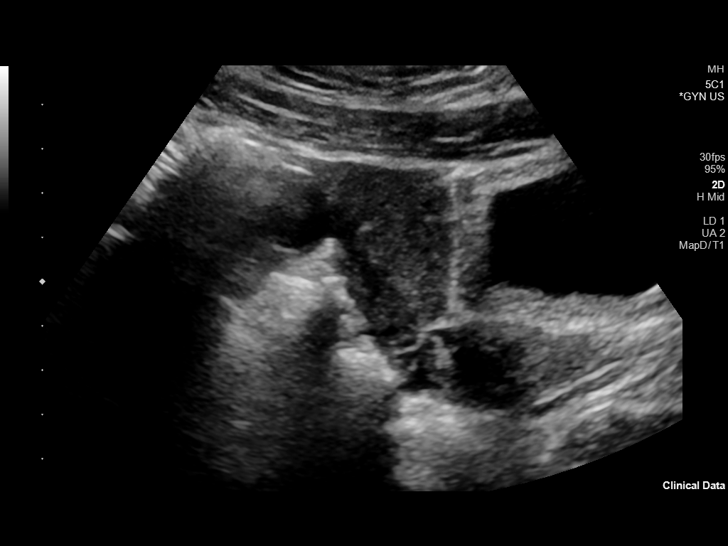
[im 10/111]
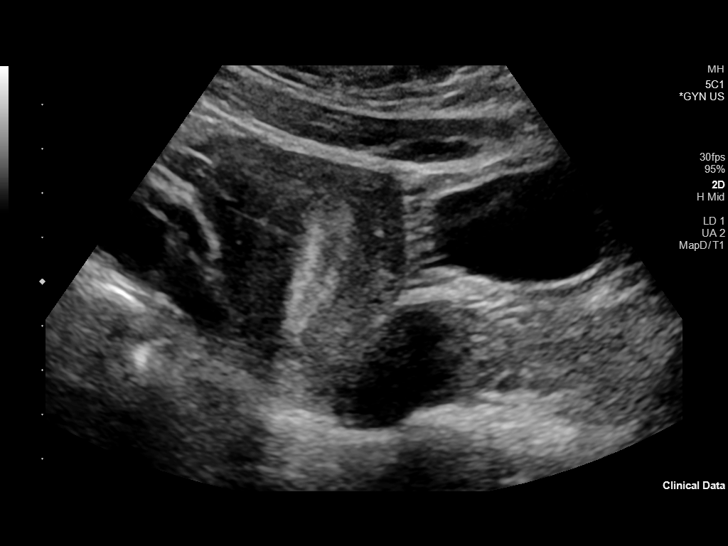
[im 19/111]
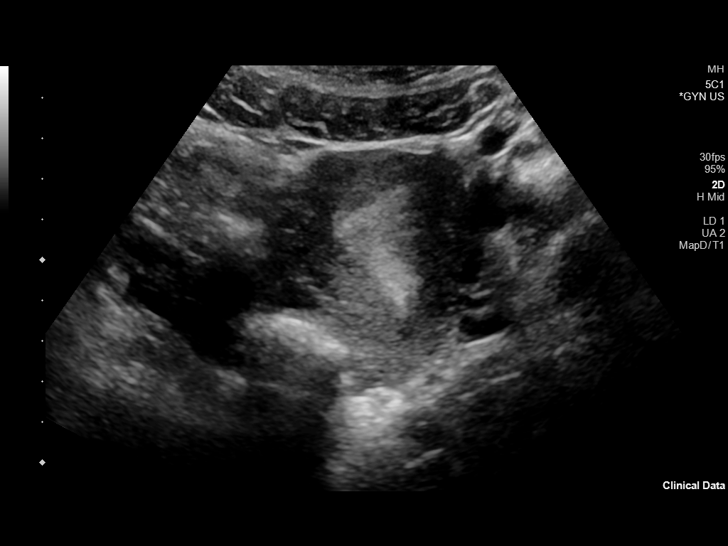
[im 28/111]
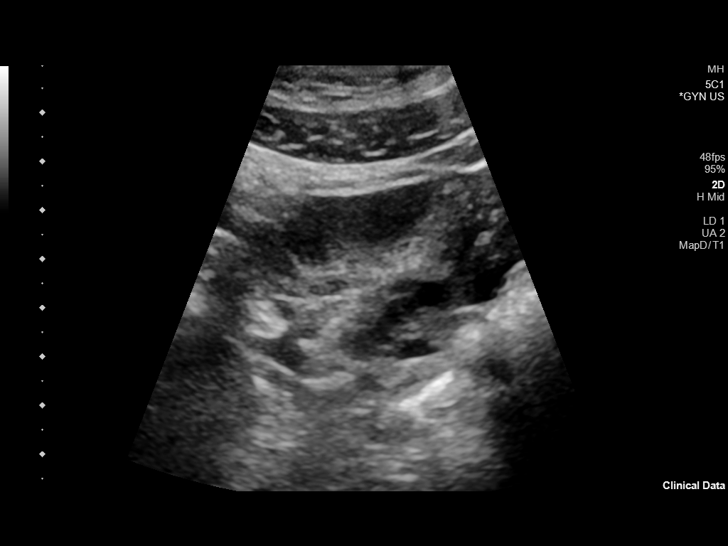
[im 37/111]
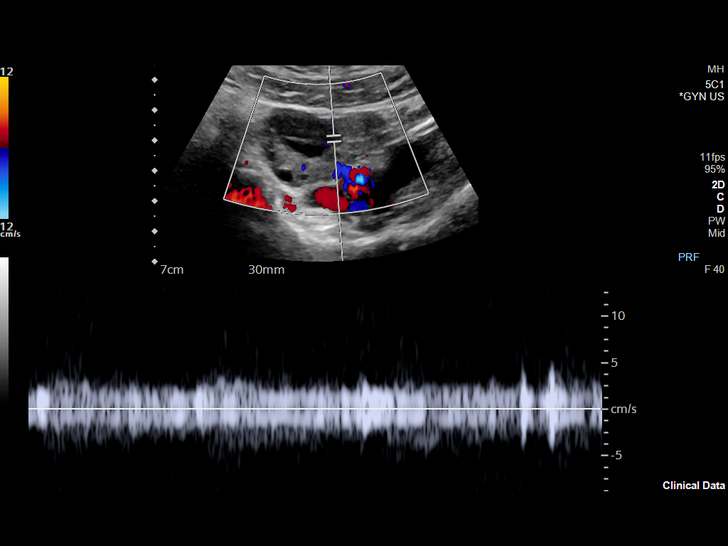
[im 46/111]
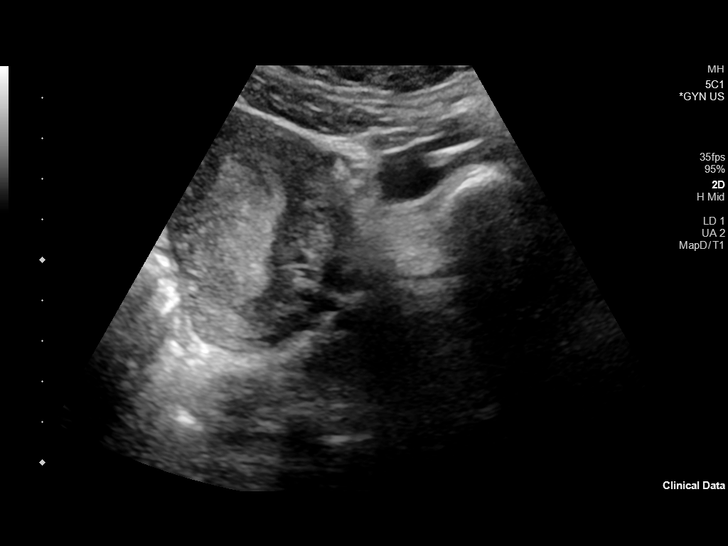
[im 56/111]
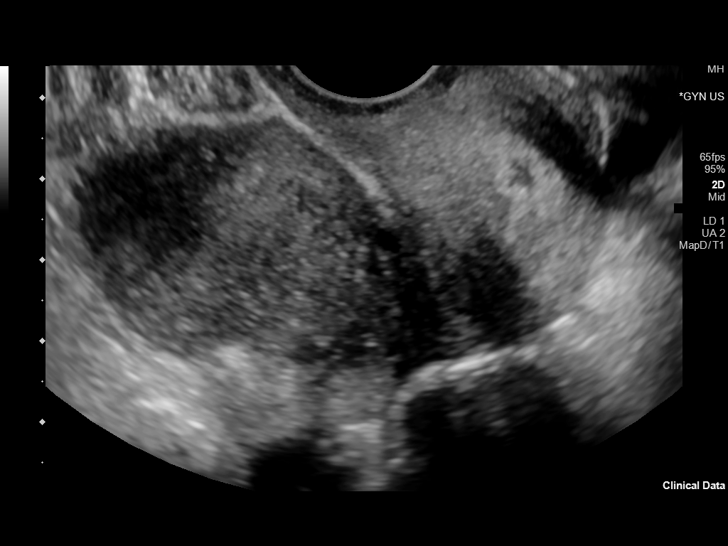
[im 65/111]
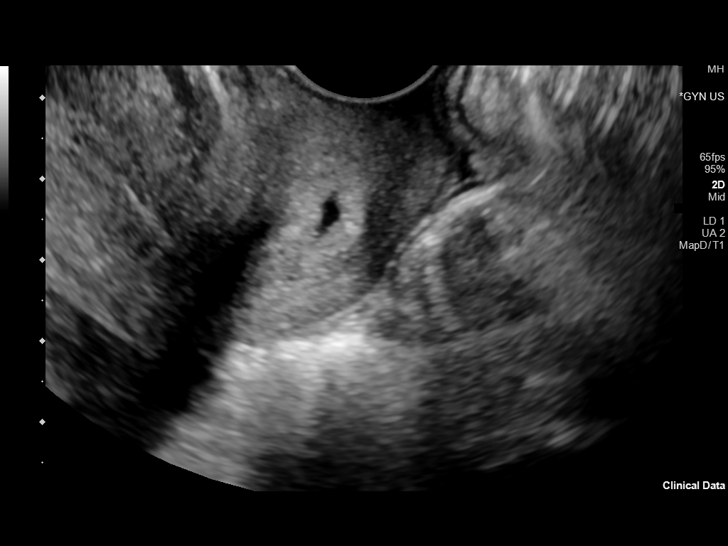
[im 74/111]
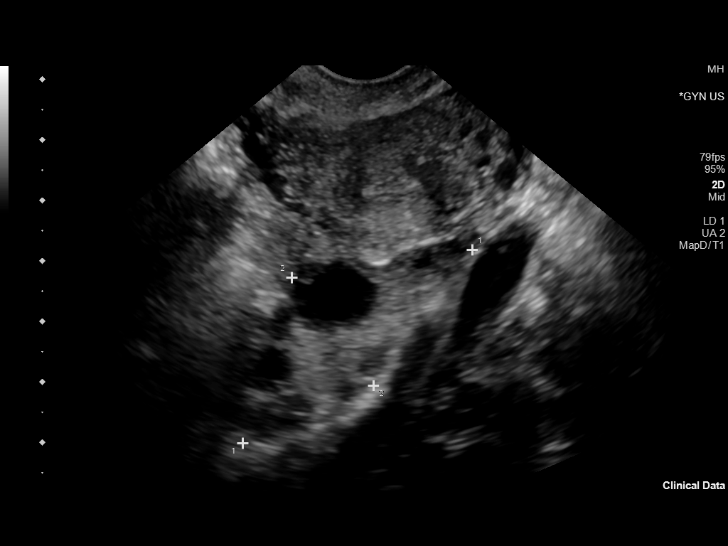
[im 83/111]
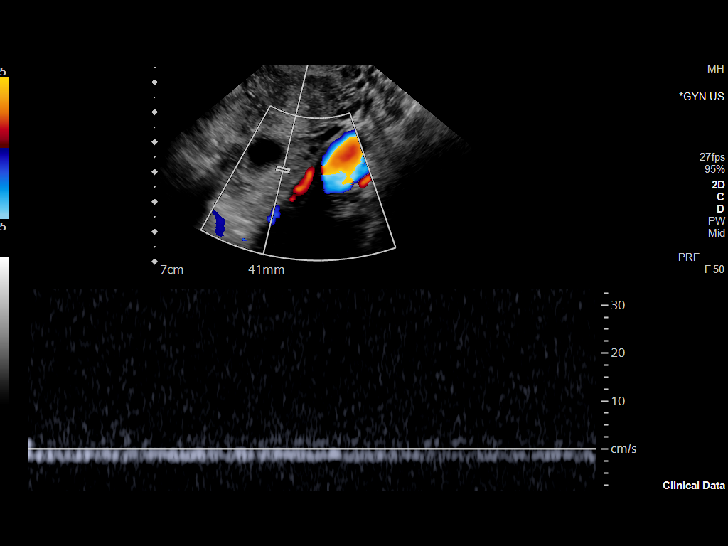
[im 92/111]
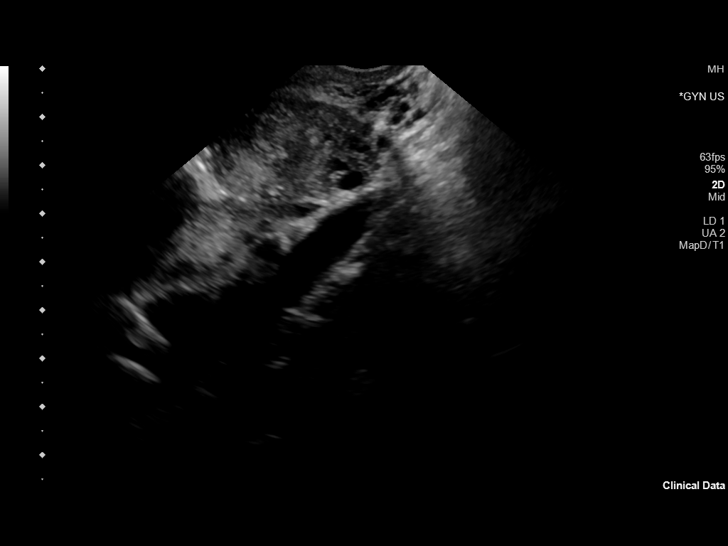
[im 101/111]
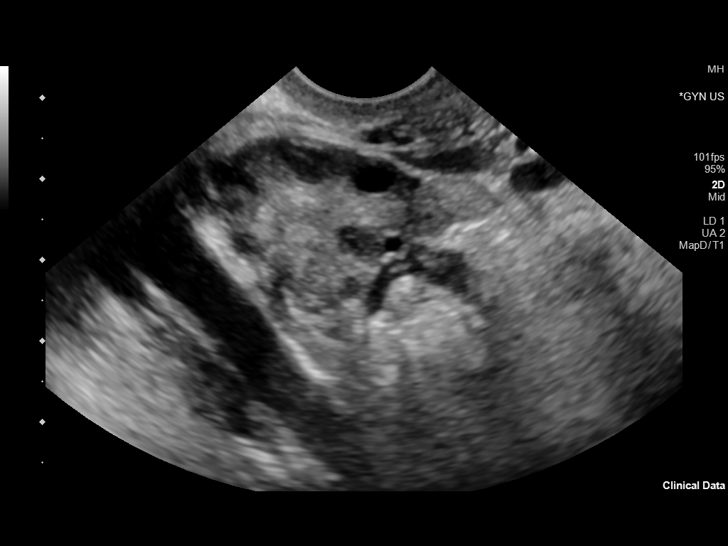
[im 111/111]
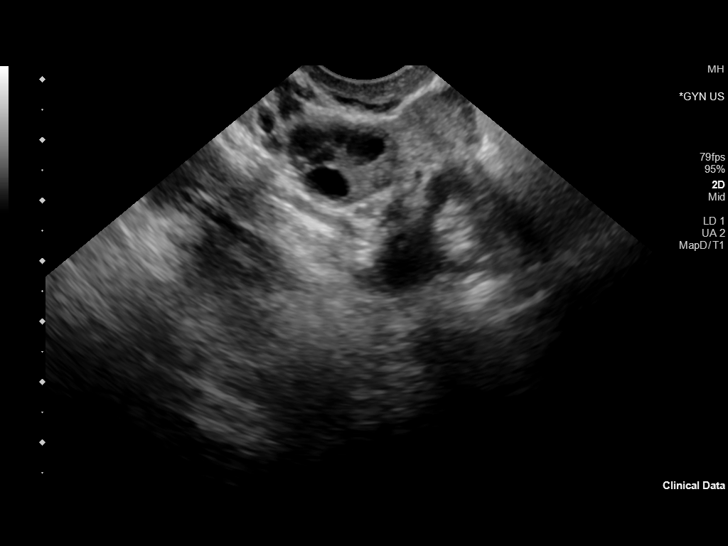

[13 of 25 positions shown; findings below may reference images not displayed]

FINDINGS: Uterus

Measurements: 7 x 4 x 5 cm = volume: 64 mL. No fibroids or other
mass visualized.

Endometrium

Thickness: 11 mm.  No focal abnormality visualized.

Right ovary

Measurements: 38 x 16 x 25 mm = volume: 8 mL. Normal appearance/no
adnexal mass.

Left ovary

Measurements: 50 x 22 x 28 mm = volume: There is 16 mL. Normal
appearance/no adnexal mass.

Pulsed Doppler evaluation of both ovaries demonstrates normal
low-resistance arterial and venous waveforms.

Other findings

No abnormal free fluid.
IMPRESSION: No acute finding.  Normal bilateral ovarian blood flow.

## 2021-06-18 IMAGING — US US TRANSVAGINAL NON-OB
1 series · 13 of 25 positions shown · non-contrast
Comparison: None.

CLINICAL DATA: Pelvic pain

EXAM:
TRANSABDOMINAL AND TRANSVAGINAL ULTRASOUND OF PELVIS
DOPPLER ULTRASOUND OF OVARIES
TECHNIQUE: Both transabdominal and transvaginal ultrasound examinations of the
pelvis were performed. Transabdominal technique was performed for
global imaging of the pelvis including uterus, ovaries, adnexal
regions, and pelvic cul-de-sac.
It was necessary to proceed with endovaginal exam following the
transabdominal exam to visualize the adnexa and endometrium. Color
and duplex Doppler ultrasound was utilized to evaluate blood flow to
the ovaries.

[Series 1: us transvaginal non-ob · 13 of 111 slices shown]
[im 1/111]
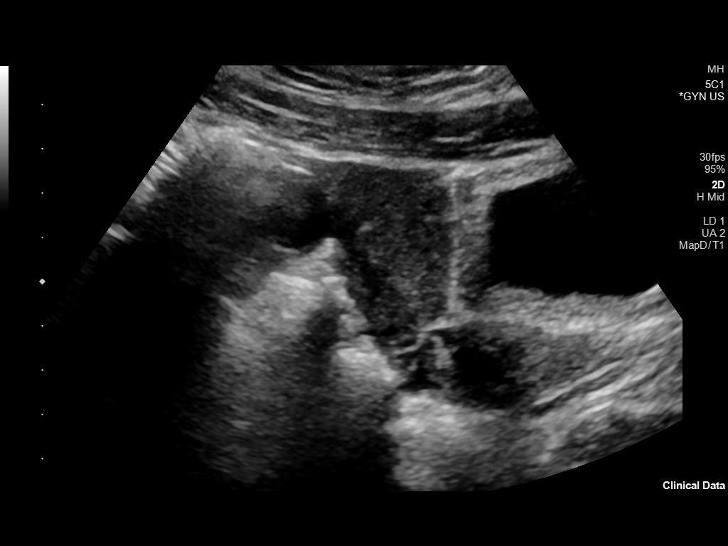
[im 10/111]
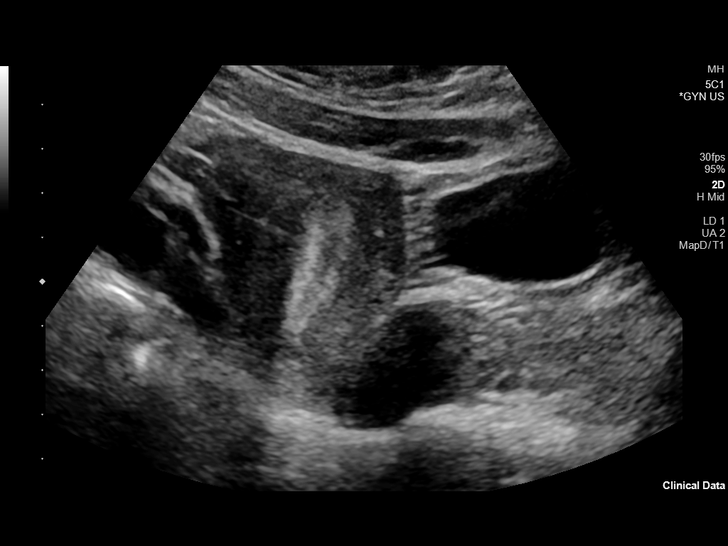
[im 19/111]
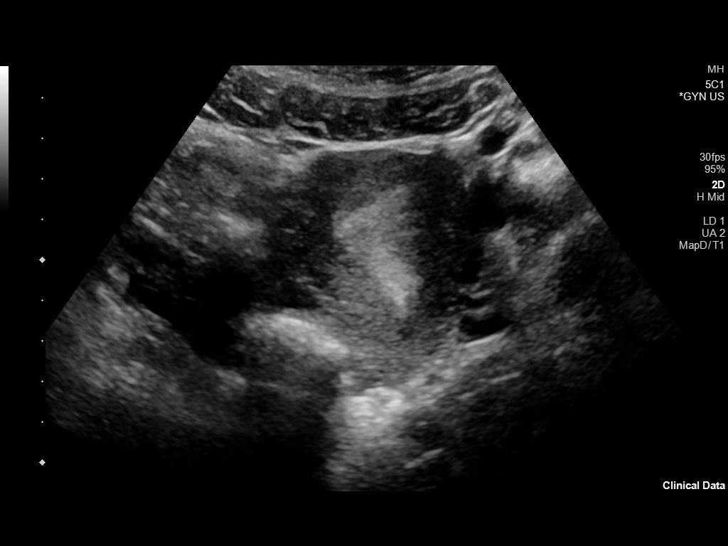
[im 28/111]
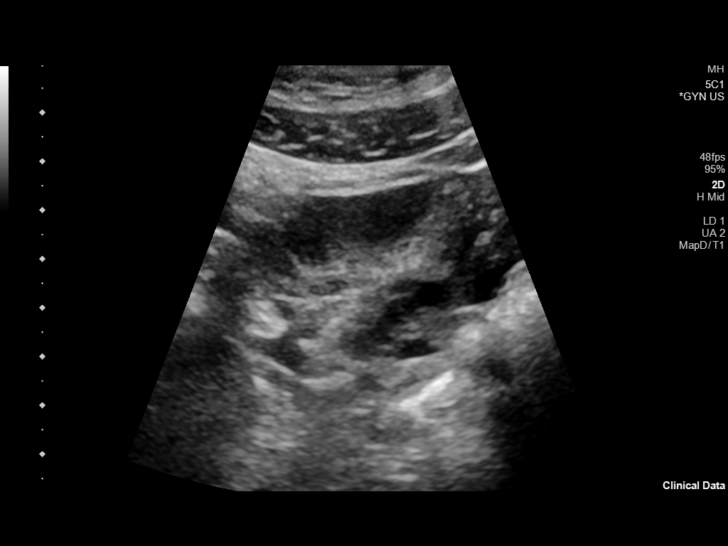
[im 37/111]
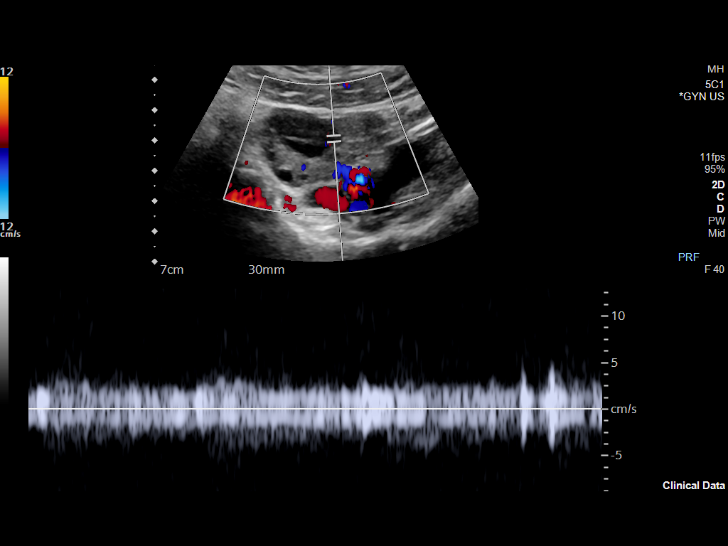
[im 46/111]
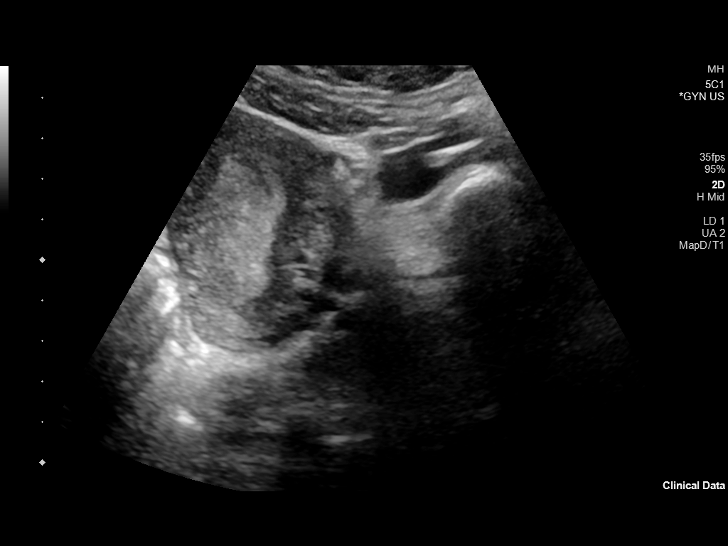
[im 56/111]
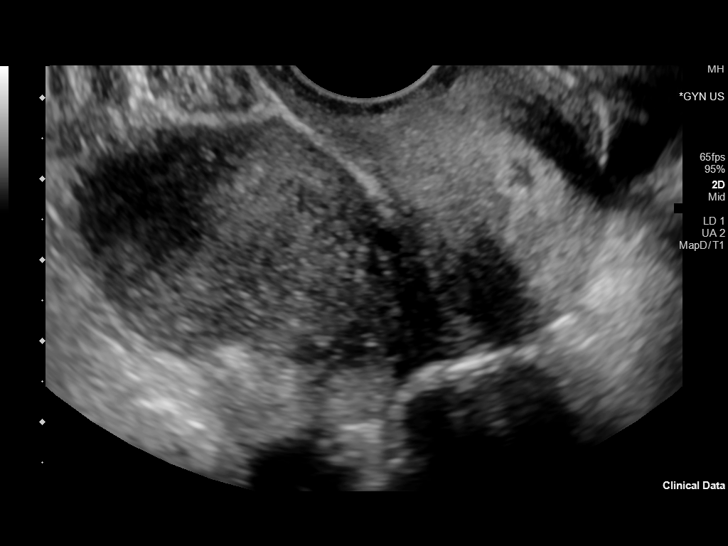
[im 65/111]
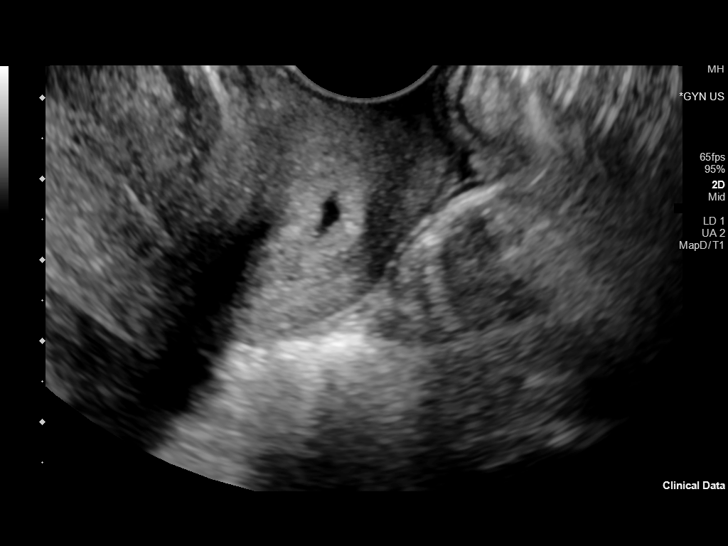
[im 74/111]
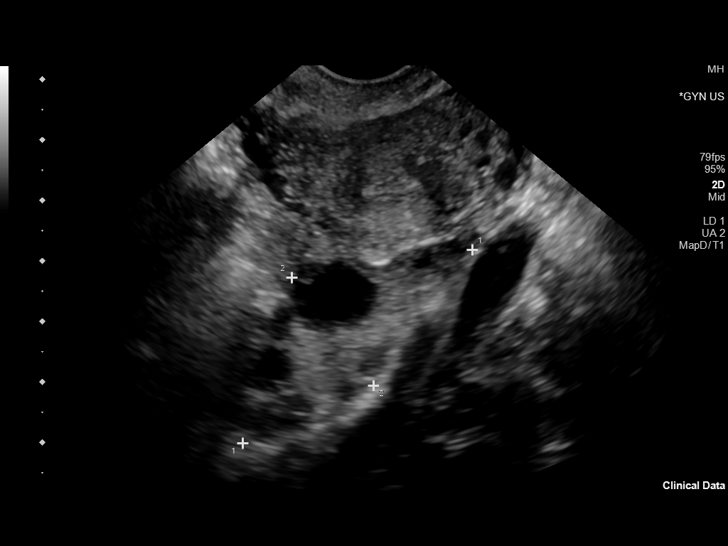
[im 83/111]
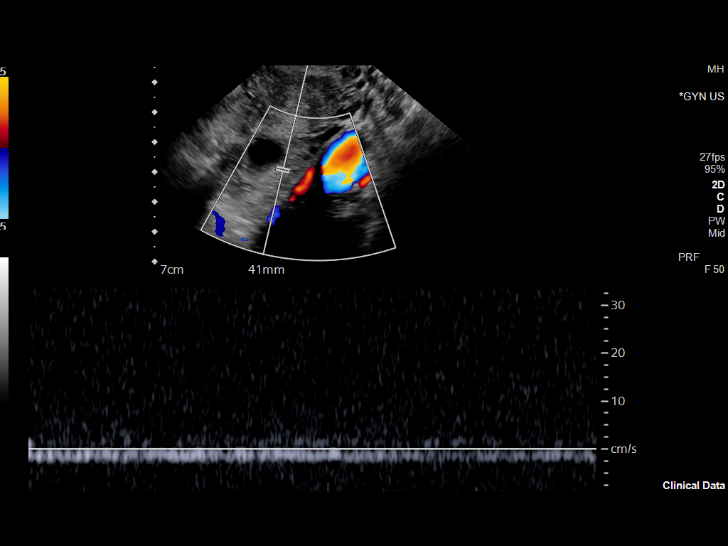
[im 92/111]
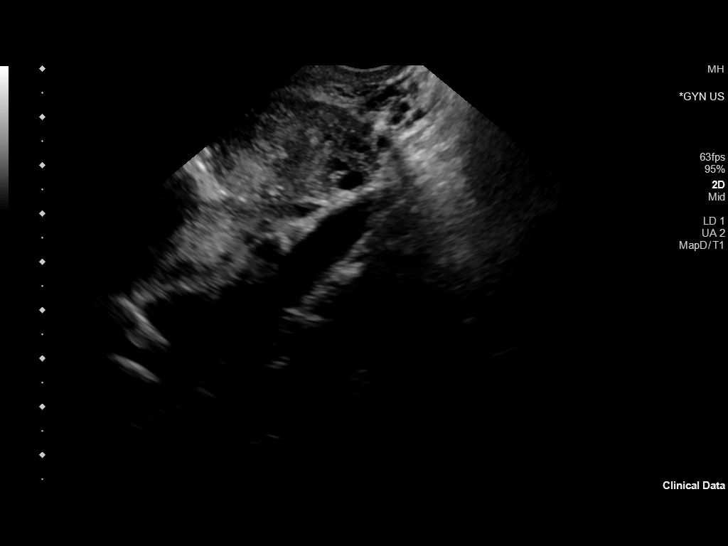
[im 101/111]
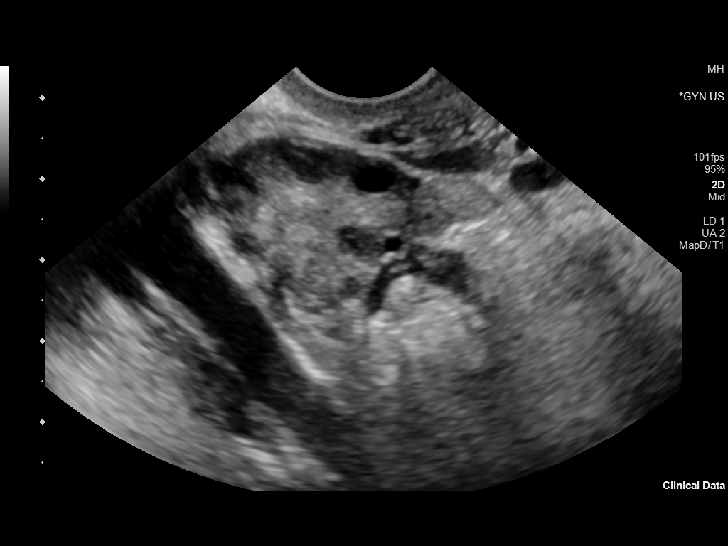
[im 111/111]
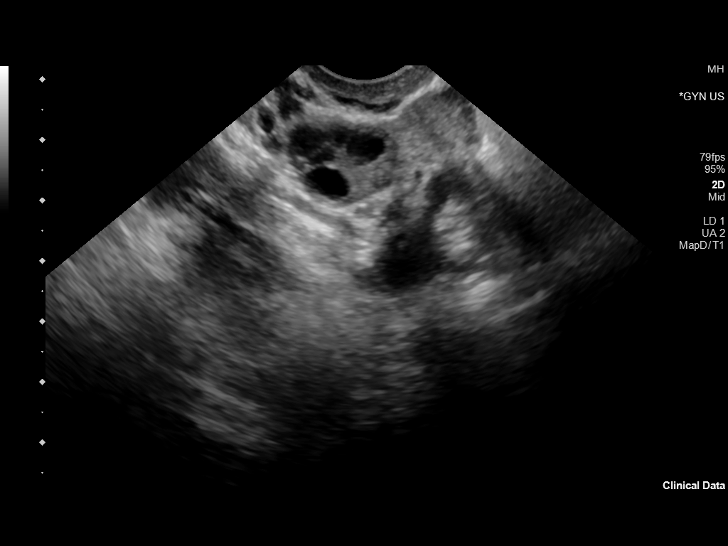

[13 of 25 positions shown; findings below may reference images not displayed]

FINDINGS: Uterus

Measurements: 7 x 4 x 5 cm = volume: 64 mL. No fibroids or other
mass visualized.

Endometrium

Thickness: 11 mm.  No focal abnormality visualized.

Right ovary

Measurements: 38 x 16 x 25 mm = volume: 8 mL. Normal appearance/no
adnexal mass.

Left ovary

Measurements: 50 x 22 x 28 mm = volume: There is 16 mL. Normal
appearance/no adnexal mass.

Pulsed Doppler evaluation of both ovaries demonstrates normal
low-resistance arterial and venous waveforms.

Other findings

No abnormal free fluid.
IMPRESSION: No acute finding.  Normal bilateral ovarian blood flow.

## 2021-06-28 LAB — OB RESULTS CONSOLE GC/CHLAMYDIA
Chlamydia: NEGATIVE
Gonorrhea: NEGATIVE

## 2021-06-28 LAB — OB RESULTS CONSOLE HIV ANTIBODY (ROUTINE TESTING): HIV: NONREACTIVE

## 2021-06-28 LAB — OB RESULTS CONSOLE HEPATITIS B SURFACE ANTIGEN: Hepatitis B Surface Ag: NEGATIVE

## 2021-06-28 LAB — OB RESULTS CONSOLE RPR: RPR: NONREACTIVE

## 2021-06-28 LAB — OB RESULTS CONSOLE RUBELLA ANTIBODY, IGM: Rubella: IMMUNE

## 2021-11-13 ENCOUNTER — Inpatient Hospital Stay (HOSPITAL_COMMUNITY)
Admission: AD | Admit: 2021-11-13 | Discharge: 2021-11-13 | Disposition: A | Discharge status: Home / Self Care | Payer: BC Managed Care – PPO | Attending: Obstetrics & Gynecology | Admitting: Obstetrics & Gynecology

## 2021-11-13 ENCOUNTER — Other Ambulatory Visit: Payer: Self-pay

## 2021-11-13 ENCOUNTER — Encounter (HOSPITAL_COMMUNITY): Payer: Self-pay | Admitting: Obstetrics & Gynecology

## 2021-11-13 DIAGNOSIS — F419 Anxiety disorder, unspecified: Secondary | ICD-10-CM | POA: Insufficient documentation

## 2021-11-13 DIAGNOSIS — R079 Chest pain, unspecified: Secondary | ICD-10-CM | POA: Diagnosis not present

## 2021-11-13 DIAGNOSIS — Z3A30 30 weeks gestation of pregnancy: Secondary | ICD-10-CM | POA: Insufficient documentation

## 2021-11-13 DIAGNOSIS — F32A Depression, unspecified: Secondary | ICD-10-CM | POA: Insufficient documentation

## 2021-11-13 DIAGNOSIS — O26893 Other specified pregnancy related conditions, third trimester: Secondary | ICD-10-CM | POA: Insufficient documentation

## 2021-11-13 DIAGNOSIS — O99343 Other mental disorders complicating pregnancy, third trimester: Secondary | ICD-10-CM | POA: Diagnosis present

## 2021-11-13 DIAGNOSIS — R030 Elevated blood-pressure reading, without diagnosis of hypertension: Secondary | ICD-10-CM

## 2021-11-13 DIAGNOSIS — R519 Headache, unspecified: Secondary | ICD-10-CM | POA: Insufficient documentation

## 2021-11-13 DIAGNOSIS — F410 Panic disorder [episodic paroxysmal anxiety] without agoraphobia: Secondary | ICD-10-CM | POA: Diagnosis not present

## 2021-11-13 HISTORY — DX: Depression, unspecified: F32.A

## 2021-11-13 HISTORY — DX: Anxiety disorder, unspecified: F41.9

## 2021-11-13 LAB — PROTEIN / CREATININE RATIO, URINE
Creatinine, Urine: 200.26 mg/dL
Protein Creatinine Ratio: 0.23 mg/mg{Cre} — ABNORMAL HIGH (ref 0.00–0.15)
Total Protein, Urine: 46 mg/dL

## 2021-11-13 LAB — CBC
HCT: 36.4 % (ref 36.0–46.0)
Hemoglobin: 12.4 g/dL (ref 12.0–15.0)
MCH: 31.2 pg (ref 26.0–34.0)
MCHC: 34.1 g/dL (ref 30.0–36.0)
MCV: 91.7 fL (ref 80.0–100.0)
Platelets: 339 10*3/uL (ref 150–400)
RBC: 3.97 MIL/uL (ref 3.87–5.11)
RDW: 13.8 % (ref 11.5–15.5)
WBC: 10 10*3/uL (ref 4.0–10.5)
nRBC: 0 % (ref 0.0–0.2)

## 2021-11-13 LAB — URINALYSIS, ROUTINE W REFLEX MICROSCOPIC
Bacteria, UA: NONE SEEN
Bilirubin Urine: NEGATIVE
Glucose, UA: NEGATIVE mg/dL
Ketones, ur: NEGATIVE mg/dL
Leukocytes,Ua: NEGATIVE
Nitrite: NEGATIVE
Protein, ur: 100 mg/dL — AB
Specific Gravity, Urine: 1.021 (ref 1.005–1.030)
pH: 6 (ref 5.0–8.0)

## 2021-11-13 LAB — COMPREHENSIVE METABOLIC PANEL
ALT: 24 U/L (ref 0–44)
AST: 21 U/L (ref 15–41)
Albumin: 3 g/dL — ABNORMAL LOW (ref 3.5–5.0)
Alkaline Phosphatase: 90 U/L (ref 38–126)
Anion gap: 9 (ref 5–15)
BUN: 5 mg/dL — ABNORMAL LOW (ref 6–20)
CO2: 20 mmol/L — ABNORMAL LOW (ref 22–32)
Calcium: 8.9 mg/dL (ref 8.9–10.3)
Chloride: 107 mmol/L (ref 98–111)
Creatinine, Ser: 0.5 mg/dL (ref 0.44–1.00)
GFR, Estimated: >60 mL/min (ref >60)
Glucose, Bld: 93 mg/dL (ref 70–99)
Potassium: 3.6 mmol/L (ref 3.5–5.1)
Sodium: 136 mmol/L (ref 135–145)
Total Bilirubin: 0.3 mg/dL (ref 0.3–1.2)
Total Protein: 6.1 g/dL — ABNORMAL LOW (ref 6.5–8.1)

## 2021-11-13 MED ORDER — ESCITALOPRAM OXALATE 5 MG PO TABS
5.0000 mg | ORAL_TABLET | Freq: Every day | ORAL | 0 refills | Status: DC
Start: 2021-11-13 — End: 2022-01-17

## 2021-11-13 MED ORDER — BUSPIRONE HCL 7.5 MG PO TABS: 7.5000 mg | ORAL_TABLET | Freq: Two times a day (BID) | ORAL | 0 refills | Status: AC

## 2021-11-13 MED ORDER — HYDROXYZINE PAMOATE 25 MG PO CAPS: 25.0000 mg | ORAL_CAPSULE | Freq: Every day | ORAL | 0 refills | Status: DC | PRN

## 2021-11-13 NOTE — MAU Provider Note (Signed)
History     CSN: [id number]  Arrival date and time: 11/13/21 1740   Event Date/Time   First Provider Initiated Contact with Patient 11/13/21 1842      Chief Complaint  Patient presents with   Hypertension   Headache   HPI Catherine Becker is a 26 y.o. G1P0 at [redacted]w[redacted]d who presents from the office for evaluation of hypertension. Had severe range BP (180s/110s) in the office today. Patient states her SBP was 150s last night at home. Denies history of hypertension. Patient believes her BPs & symptoms to be anxiety related. Has history of anxiety, panic attacks, and depression. Was on lexapro, lamotrigine, and abilify until her pregnancy when she discontinued her meds. Also sees a therapist whom she last saw 2 weeks ago.  Anxiety last year was situational & resolved after removal of stressors and with medication. Symptoms returned last week due to break up with her significant other. Since last week she has had headaches, nausea, dizziness, and anxiety. Also reports a 20 minute episode of substernal chest pain last night that occurred during what she felt was a panic attack. Symptoms make it difficult to sleep. Denies suicidal thoughts. Denies abdominal pain, vaginal bleeding, or LOF. Reports normal fetal movement.  Currently denies any symptoms.   OB History     Gravida  1   Para      Term      Preterm      AB      Living         SAB      IAB      Ectopic      Multiple      Live Births              Past Medical History:  Diagnosis Date   Anxiety    Depression    Trichimoniasis     History reviewed. No pertinent surgical history.  Family History  Problem Relation Age of Onset   Healthy Mother    Healthy Father    Cancer Maternal Uncle    Cancer Maternal Uncle    Cancer Maternal Grandfather    Cancer Paternal Grandmother     Social History   Tobacco Use   Smoking status: Never   Smokeless tobacco: Never  Vaping Use   Vaping Use: Never used  Substance  Use Topics   Alcohol use: Yes    Alcohol/week: 1.0 standard drink    Types: 1 Glasses of wine per week    Comment: Occassionally   Drug use: Never    Allergies: No Known Allergies  Medications Prior to Admission  Medication Sig Dispense Refill Last Dose   folic acid (FOLVITE) 1 MG tablet Take 1 mg by mouth daily.   11/12/2021   Prenatal Vit-Fe Fumarate-FA (PRENATAL MULTIVITAMIN) TABS tablet Take 1 tablet by mouth daily at 12 noon.   11/12/2021   chlorhexidine (PERIDEX) 0.12 % solution Use as directed 15 mLs in the mouth or throat 2 (two) times daily. 120 mL 0    clotrimazole (GYNE-LOTRIMIN 3) 2 % vaginal cream Place 1 Applicatorful vaginally at bedtime. (Patient taking differently: Place 1 Applicatorful vaginally as needed. ) 21 g 0    lidocaine (XYLOCAINE) 2 % solution SMARTSIG:Sparingly By Mouth 4 Times Daily PRN       Review of Systems  Constitutional: Negative.   Cardiovascular:  Positive for chest pain (x1 episode last night).  Gastrointestinal: Negative.   Genitourinary: Negative.   Neurological:  Positive for dizziness  and headaches (none currently).  Psychiatric/Behavioral:  Positive for sleep disturbance. Negative for self-injury and suicidal ideas. The patient is nervous/anxious.   Physical Exam   Blood pressure 116/74, pulse 98, temperature 98.7 F (37.1 C), temperature source Oral, resp. rate 18, height 5\' 5"  (1.651 m), weight (!) 138.1 kg, last menstrual period 04/15/2021, SpO2 100 %.  Temp:  [98.7 F (37.1 C)] 98.7 F (37.1 C) (02/13 1757) Pulse Rate:  [97-117] 98 (02/13 2000) Resp:  [18] 18 (02/13 1757) BP: (106-134)/(44-86) 116/74 (02/13 2000) SpO2:  [99 %-100 %] 100 % (02/13 2000) Weight:  [138.1 kg] 138.1 kg (02/13 1752)  Physical Exam Vitals and nursing note reviewed.  Constitutional:      General: She is not in acute distress.    Appearance: She is well-developed.  HENT:     Head: Normocephalic and atraumatic.  Eyes:     General: No scleral icterus.     Extraocular Movements:     Right eye: No nystagmus.     Left eye: No nystagmus.     Pupils: Pupils are equal, round, and reactive to light.  Pulmonary:     Effort: Pulmonary effort is normal. No respiratory distress.  Neurological:     Mental Status: She is alert.     Deep Tendon Reflexes: Reflexes normal.  Psychiatric:        Attention and Perception: Attention and perception normal.        Mood and Affect: Mood and affect normal.        Speech: Speech normal.        Behavior: Behavior normal.        Judgment: Judgment normal.   NST:  Baseline: 150 bpm, Variability: Good {> 6 bpm), Accelerations: 10x10, and Decelerations: Absent  MAU Course  Procedures Results for orders placed or performed during the hospital encounter of 11/13/21 (from the past 24 hour(s))  Urinalysis, Routine w reflex microscopic Urine, Clean Catch     Status: Abnormal   Collection Time: 11/13/21  6:19 PM  Result Value Ref Range   Color, Urine YELLOW YELLOW   APPearance HAZY (A) CLEAR   Specific Gravity, Urine 1.021 1.005 - 1.030   pH 6.0 5.0 - 8.0   Glucose, UA NEGATIVE NEGATIVE mg/dL   Hgb urine dipstick MODERATE (A) NEGATIVE   Bilirubin Urine NEGATIVE NEGATIVE   Ketones, ur NEGATIVE NEGATIVE mg/dL   Protein, ur 412 (A) NEGATIVE mg/dL   Nitrite NEGATIVE NEGATIVE   Leukocytes,Ua NEGATIVE NEGATIVE   RBC / HPF 6-10 0 - 5 RBC/hpf   WBC, UA 0-5 0 - 5 WBC/hpf   Bacteria, UA NONE SEEN NONE SEEN   Squamous Epithelial / LPF 0-5 0 - 5   Mucus PRESENT    Ca Oxalate Crys, UA PRESENT   Protein / creatinine ratio, urine     Status: Abnormal   Collection Time: 11/13/21  6:23 PM  Result Value Ref Range   Creatinine, Urine 200.26 mg/dL   Total Protein, Urine 46 mg/dL   Protein Creatinine Ratio 0.23 (H) 0.00 - 0.15 mg/mg[Cre]  CBC     Status: None   Collection Time: 11/13/21  6:59 PM  Result Value Ref Range   WBC 10.0 4.0 - 10.5 K/uL   RBC 3.97 3.87 - 5.11 MIL/uL   Hemoglobin 12.4 12.0 - 15.0 g/dL   HCT  87.8 67.6 - 72.0 %   MCV 91.7 80.0 - 100.0 fL   MCH 31.2 26.0 - 34.0 pg   MCHC 34.1 30.0 -  36.0 g/dL   RDW 49.1 79.1 - 50.5 %   Platelets 339 150 - 400 K/uL   nRBC 0.0 0.0 - 0.2 %  Comprehensive metabolic panel     Status: Abnormal   Collection Time: 11/13/21  6:59 PM  Result Value Ref Range   Sodium 136 135 - 145 mmol/L   Potassium 3.6 3.5 - 5.1 mmol/L   Chloride 107 98 - 111 mmol/L   CO2 20 (L) 22 - 32 mmol/L   Glucose, Bld 93 70 - 99 mg/dL   BUN 5 (L) 6 - 20 mg/dL   Creatinine, Ser 6.97 0.44 - 1.00 mg/dL   Calcium 8.9 8.9 - 94.8 mg/dL   Total Protein 6.1 (L) 6.5 - 8.1 g/dL   Albumin 3.0 (L) 3.5 - 5.0 g/dL   AST 21 15 - 41 U/L   ALT 24 0 - 44 U/L   Alkaline Phosphatase 90 38 - 126 U/L   Total Bilirubin 0.3 0.3 - 1.2 mg/dL   GFR, Estimated >01 >65 mL/min   Anion gap 9 5 - 15    MDM Patient sent to MAU for evaluation of hypertension in the office today. In MAU she is normotensive. Preeclampsia labs normal.  Also reports episodes of headache, dizziness, & nausea as well as one episode of chest pain last night. She is currently not having symptoms. EKG today is normal. Patient attributes these symptoms to anxiety that is worsened right now due to relationship issues. Dr. Juliene Pina notified of her evaluation. Would like patient started on lexapro 5 mg daily, buspar 7.5 mg BID, & few days of vistaril. Patient has f/u with Dr. Amado Nash later this week.   Assessment and Plan   1. Anxiety during pregnancy in third trimester, antepartum  -patient will call her therapist in the morning to schedule follow up -discussed medication to start & rx sent in for lexapro, buspar, vistaril  2. Elevated BP without diagnosis of hypertension  -normotensive in MAU -given info regarding s/s of preeclampsia -keep f/u with ob later this week  3. [redacted] weeks gestation of pregnancy      Judeth Horn 11/13/2021, 8:30 PM

## 2021-11-13 NOTE — MAU Note (Signed)
Catherine Becker is a 26 y.o. at [redacted]w[redacted]d here in MAU reporting: sent over from the office due to elevated BP. Pt reports she thinks it might be stress related. Does report a headache. Yesterday had some nausea, dizziness, and chest pain. No visual changes. No labor complaints. +FM  Onset of complaint: today  Pain score: 3/10  Vitals:   11/13/21 1757  BP: 134/86  Pulse: (!) 117  Resp: 18  Temp: 98.7 F (37.1 C)  SpO2: 99%     FHT:150  Lab orders placed from triage: UA

## 2021-12-20 LAB — OB RESULTS CONSOLE GBS: GBS: POSITIVE

## 2021-12-30 ENCOUNTER — Inpatient Hospital Stay (HOSPITAL_COMMUNITY)
Admission: AD | Admit: 2021-12-30 | Discharge: 2021-12-30 | Disposition: A | Discharge status: Home / Self Care | Payer: BC Managed Care – PPO | Attending: Obstetrics and Gynecology | Admitting: Obstetrics and Gynecology

## 2021-12-30 ENCOUNTER — Inpatient Hospital Stay (HOSPITAL_COMMUNITY): Payer: BC Managed Care – PPO

## 2021-12-30 ENCOUNTER — Encounter (HOSPITAL_COMMUNITY): Payer: Self-pay | Admitting: Obstetrics and Gynecology

## 2021-12-30 ENCOUNTER — Other Ambulatory Visit: Payer: Self-pay

## 2021-12-30 DIAGNOSIS — R0789 Other chest pain: Secondary | ICD-10-CM | POA: Diagnosis not present

## 2021-12-30 DIAGNOSIS — R071 Chest pain on breathing: Secondary | ICD-10-CM | POA: Insufficient documentation

## 2021-12-30 DIAGNOSIS — Z3A37 37 weeks gestation of pregnancy: Secondary | ICD-10-CM | POA: Insufficient documentation

## 2021-12-30 DIAGNOSIS — R051 Acute cough: Secondary | ICD-10-CM | POA: Insufficient documentation

## 2021-12-30 DIAGNOSIS — O26893 Other specified pregnancy related conditions, third trimester: Secondary | ICD-10-CM | POA: Diagnosis not present

## 2021-12-30 DIAGNOSIS — Z20822 Contact with and (suspected) exposure to covid-19: Secondary | ICD-10-CM | POA: Insufficient documentation

## 2021-12-30 DIAGNOSIS — R0781 Pleurodynia: Secondary | ICD-10-CM | POA: Diagnosis present

## 2021-12-30 LAB — RESP PANEL BY RT-PCR (FLU A&B, COVID) ARPGX2
Influenza A by PCR: NEGATIVE
Influenza B by PCR: NEGATIVE
SARS Coronavirus 2 by RT PCR: NEGATIVE

## 2021-12-30 MED ORDER — CYCLOBENZAPRINE HCL 10 MG PO TABS: 10.0000 mg | ORAL_TABLET | Freq: Two times a day (BID) | ORAL | 0 refills | Status: AC | PRN

## 2021-12-30 MED ORDER — ACETAMINOPHEN 500 MG PO TABS
1000.0000 mg | ORAL_TABLET | Freq: Once | ORAL | Status: AC
Start: 2021-12-30 — End: 2021-12-30
  Administered 2021-12-30: 1000 mg via ORAL
  Filled 2021-12-30: qty 2

## 2021-12-30 MED ORDER — BENZONATATE 100 MG PO CAPS: 100.0000 mg | ORAL_CAPSULE | Freq: Three times a day (TID) | ORAL | 0 refills | Status: AC

## 2021-12-30 NOTE — MAU Note (Signed)
Catherine Becker is a 26 y.o. at [redacted]w[redacted]d here in MAU reporting: for the past 1.5 has had a cold and has been coughing and that has caused some rib pain. This AM she sneezed and heard a pop and is now having a lot of left sided rib pain. No bleeding or LOF. +FM.  ? ?Onset of complaint: ongoing but worse today ? ?Pain score: 8/10 ? ?Vitals:  ? 12/30/21 1204  ?BP: 130/80  ?Pulse: (!) 112  ?Resp: 20  ?Temp: 98.7 ?F (37.1 ?C)  ?SpO2: 98%  ?   ?FHT:165 ? ?Lab orders placed from triage: UA  ? ?

## 2021-12-30 NOTE — MAU Provider Note (Signed)
?History  ?  ? ?CSN: [id number] ? ?Arrival date and time: 12/30/21 1145 ? ? Event Date/Time  ? First Provider Initiated Contact with Patient 12/30/21 1215   ?  ? ?Chief Complaint  ?Patient presents with  ? Chest Pain  ?  Left ribs  ? Shortness of Breath  ? ?HPI ?Catherine Becker is a 26 y.o. G1P0 at [redacted]w[redacted]d who presents with left rib pain. She reports she has had a cough and congestion that she attributes to a cold for the last week. She reports she has had intermittent rib pain for 4-5 days. She reports this morning she sneezed and felt a pop and the pain is worse. She did not try anything for the pain, she came straight to the hospital. She denies any bleeding or leaking. Reports normal fetal movement.  ? ?OB History   ? ? Gravida  ?1  ? Para  ?   ? Term  ?   ? Preterm  ?   ? AB  ?   ? Living  ?   ?  ? ? SAB  ?   ? IAB  ?   ? Ectopic  ?   ? Multiple  ?   ? Live Births  ?   ?   ?  ?  ? ? ?Past Medical History:  ?Diagnosis Date  ? Anxiety   ? Depression   ? Trichimoniasis   ? ? ?No past surgical history on file. ? ?Family History  ?Problem Relation Age of Onset  ? Healthy Mother   ? Healthy Father   ? Cancer Maternal Uncle   ? Cancer Maternal Uncle   ? Cancer Maternal Grandfather   ? Cancer Paternal Grandmother   ? ? ?Social History  ? ?Tobacco Use  ? Smoking status: Never  ? Smokeless tobacco: Never  ?Vaping Use  ? Vaping Use: Never used  ?Substance Use Topics  ? Alcohol use: Yes  ?  Alcohol/week: 1.0 standard drink  ?  Types: 1 Glasses of wine per week  ?  Comment: Occassionally  ? Drug use: Never  ? ? ?Allergies: No Known Allergies ? ?Medications Prior to Admission  ?Medication Sig Dispense Refill Last Dose  ? busPIRone (BUSPAR) 7.5 MG tablet Take 1 tablet (7.5 mg total) by mouth 2 (two) times daily. 60 tablet 0   ? escitalopram (LEXAPRO) 5 MG tablet Take 1 tablet (5 mg total) by mouth daily. 30 tablet 0   ? folic acid (FOLVITE) 1 MG tablet Take 1 mg by mouth daily.     ? hydrOXYzine (VISTARIL) 25 MG capsule Take 1  capsule (25 mg total) by mouth daily as needed. 14 capsule 0   ? lidocaine (XYLOCAINE) 2 % solution SMARTSIG:Sparingly By Mouth 4 Times Daily PRN     ? Prenatal Vit-Fe Fumarate-FA (PRENATAL MULTIVITAMIN) TABS tablet Take 1 tablet by mouth daily at 12 noon.     ? ? ?Review of Systems  ?Constitutional: Negative.  Negative for fatigue and fever.  ?HENT:  Positive for congestion.   ?Respiratory:  Positive for cough. Negative for shortness of breath.   ?Cardiovascular: Negative.  Negative for chest pain.  ?Gastrointestinal: Negative.  Negative for abdominal pain, constipation, diarrhea, nausea and vomiting.  ?Genitourinary: Negative.  Negative for dysuria, vaginal bleeding and vaginal discharge.  ?Musculoskeletal:   ?     Rib pain  ?Neurological: Negative.  Negative for dizziness and headaches.  ?Physical Exam  ? ?Blood pressure 130/80, pulse (!) 112, temperature 98.7 ?F (  37.1 ?C), temperature source Oral, resp. rate 20, height 5\' 5"  (1.651 m), weight (!) 139.1 kg, last menstrual period 04/15/2021, SpO2 98 %. ? ?Physical Exam ?Vitals and nursing note reviewed.  ?Constitutional:   ?   General: She is not in acute distress. ?   Appearance: She is well-developed.  ?HENT:  ?   Head: Normocephalic.  ?Eyes:  ?   Pupils: Pupils are equal, round, and reactive to light.  ?Cardiovascular:  ?   Rate and Rhythm: Normal rate and regular rhythm.  ?   Heart sounds: Normal heart sounds.  ?Pulmonary:  ?   Effort: Pulmonary effort is normal. No respiratory distress.  ?   Breath sounds: Normal breath sounds.  ?Abdominal:  ?   General: Bowel sounds are normal. There is no distension.  ?   Palpations: Abdomen is soft.  ?   Tenderness: There is no abdominal tenderness.  ?Skin: ?   General: Skin is warm and dry.  ?Neurological:  ?   Mental Status: She is alert and oriented to person, place, and time.  ?Psychiatric:     ?   Mood and Affect: Mood normal.     ?   Behavior: Behavior normal.     ?   Thought Content: Thought content normal.     ?    Judgment: Judgment normal.  ? ?Fetal Tracing: ? ?Baseline: 155 ?Variability: moderate ?Accels: 15x15 ?Decels: none ? ?Toco: none ? ? ?MAU Course  ?Procedures ?Results for orders placed or performed during the hospital encounter of 12/30/21 (from the past 24 hour(s))  ?Resp Panel by RT-PCR (Flu A&B, Covid) Nasopharyngeal Swab     Status: None  ? Collection Time: 12/30/21 12:30 PM  ? Specimen: Nasopharyngeal Swab; Nasopharyngeal(NP) swabs in vial transport medium  ?Result Value Ref Range  ? SARS Coronavirus 2 by RT PCR NEGATIVE NEGATIVE  ? Influenza A by PCR NEGATIVE NEGATIVE  ? Influenza B by PCR NEGATIVE NEGATIVE  ?  ?DG Chest 2 View ? ?Result Date: 12/30/2021 ?CLINICAL DATA:  Left-sided lower chest and rib pain. EXAM: CHEST - 2 VIEW COMPARISON:  None. FINDINGS: The heart size and mediastinal contours are within normal limits. There is no evidence of pulmonary edema, consolidation, pneumothorax, nodule or pleural fluid. The visualized skeletal structures are unremarkable. IMPRESSION: No active cardiopulmonary disease. Electronically Signed   By: Aletta Edouard M.D.   On: 12/30/2021 13:31    ? ?MDM ?Tylenol PO ?Patient driving and unable to take anything that may impact ability to drive ?Chest Xray ? ?Comfort measures reviewed at length ? ?Assessment and Plan  ? ?1. Costochondral pain   ?2. Acute cough   ?3. [redacted] weeks gestation of pregnancy   ? ?-Discharge home in stable condition ?-Rx for tessalon perles and flexeril sent to pharmacy ?-Patient advised to follow-up with OB as scheduled for prenatal care ?-Patient may return to MAU as needed or if her condition were to change or worsen ? ? ?Wende Mott CNM ?12/30/2021, 12:15 PM  ?

## 2021-12-30 NOTE — Discharge Instructions (Signed)

## 2022-01-09 ENCOUNTER — Telehealth (HOSPITAL_COMMUNITY): Payer: Self-pay | Admitting: *Deleted

## 2022-01-09 NOTE — Telephone Encounter (Signed)
Preadmission screen  

## 2022-01-10 ENCOUNTER — Encounter (HOSPITAL_COMMUNITY): Payer: Self-pay | Admitting: *Deleted

## 2022-01-12 ENCOUNTER — Other Ambulatory Visit: Payer: Self-pay | Admitting: Obstetrics and Gynecology

## 2022-01-14 ENCOUNTER — Other Ambulatory Visit: Payer: Self-pay

## 2022-01-15 ENCOUNTER — Other Ambulatory Visit: Payer: Self-pay

## 2022-01-15 ENCOUNTER — Inpatient Hospital Stay (HOSPITAL_COMMUNITY): Payer: BC Managed Care – PPO

## 2022-01-15 ENCOUNTER — Encounter (HOSPITAL_COMMUNITY)
Admission: AD | Disposition: A | Discharge status: Home / Self Care | Payer: Self-pay | Source: Home / Self Care | Attending: Obstetrics and Gynecology

## 2022-01-15 ENCOUNTER — Inpatient Hospital Stay (HOSPITAL_COMMUNITY): Payer: BC Managed Care – PPO | Admitting: Anesthesiology

## 2022-01-15 ENCOUNTER — Inpatient Hospital Stay (HOSPITAL_COMMUNITY)
Admission: AD | Admit: 2022-01-15 | Discharge: 2022-01-17 | DRG: 788 | Disposition: A | Discharge status: Home / Self Care | Payer: BC Managed Care – PPO | Attending: Obstetrics and Gynecology | Admitting: Obstetrics and Gynecology

## 2022-01-15 ENCOUNTER — Encounter (HOSPITAL_COMMUNITY): Payer: Self-pay | Admitting: Obstetrics and Gynecology

## 2022-01-15 DIAGNOSIS — O339 Maternal care for disproportion, unspecified: Secondary | ICD-10-CM | POA: Diagnosis present

## 2022-01-15 DIAGNOSIS — O99214 Obesity complicating childbirth: Secondary | ICD-10-CM | POA: Diagnosis present

## 2022-01-15 DIAGNOSIS — O99824 Streptococcus B carrier state complicating childbirth: Secondary | ICD-10-CM | POA: Diagnosis present

## 2022-01-15 DIAGNOSIS — Z3A39 39 weeks gestation of pregnancy: Secondary | ICD-10-CM | POA: Diagnosis not present

## 2022-01-15 DIAGNOSIS — O99344 Other mental disorders complicating childbirth: Secondary | ICD-10-CM | POA: Diagnosis present

## 2022-01-15 DIAGNOSIS — O10919 Unspecified pre-existing hypertension complicating pregnancy, unspecified trimester: Secondary | ICD-10-CM | POA: Diagnosis present

## 2022-01-15 DIAGNOSIS — O1002 Pre-existing essential hypertension complicating childbirth: Principal | ICD-10-CM | POA: Diagnosis present

## 2022-01-15 DIAGNOSIS — B951 Streptococcus, group B, as the cause of diseases classified elsewhere: Secondary | ICD-10-CM | POA: Diagnosis present

## 2022-01-15 DIAGNOSIS — F419 Anxiety disorder, unspecified: Secondary | ICD-10-CM | POA: Diagnosis present

## 2022-01-15 DIAGNOSIS — F32A Depression, unspecified: Secondary | ICD-10-CM | POA: Diagnosis present

## 2022-01-15 DIAGNOSIS — Z98891 History of uterine scar from previous surgery: Secondary | ICD-10-CM

## 2022-01-15 LAB — CBC
HCT: 38.8 % (ref 36.0–46.0)
HCT: 36.7 % (ref 36.0–46.0)
Hemoglobin: 13.4 g/dL (ref 12.0–15.0)
Hemoglobin: 12.6 g/dL (ref 12.0–15.0)
MCH: 31.2 pg (ref 26.0–34.0)
MCH: 31.3 pg (ref 26.0–34.0)
MCHC: 34.5 g/dL (ref 30.0–36.0)
MCHC: 34.3 g/dL (ref 30.0–36.0)
MCV: 90.4 fL (ref 80.0–100.0)
MCV: 91.1 fL (ref 80.0–100.0)
Platelets: 344 10*3/uL (ref 150–400)
Platelets: 299 10*3/uL (ref 150–400)
RBC: 4.29 MIL/uL (ref 3.87–5.11)
RBC: 4.03 MIL/uL (ref 3.87–5.11)
RDW: 13.9 % (ref 11.5–15.5)
RDW: 13.9 % (ref 11.5–15.5)
WBC: 11.6 10*3/uL — ABNORMAL HIGH (ref 4.0–10.5)
WBC: 11.5 10*3/uL — ABNORMAL HIGH (ref 4.0–10.5)
nRBC: 0 % (ref 0.0–0.2)
nRBC: 0 % (ref 0.0–0.2)

## 2022-01-15 LAB — TYPE AND SCREEN
ABO/RH(D): O POS
Antibody Screen: NEGATIVE

## 2022-01-15 LAB — RPR: RPR Ser Ql: NONREACTIVE

## 2022-01-15 SURGERY — Surgical Case
Anesthesia: Epidural

## 2022-01-15 MED ORDER — SCOPOLAMINE 1 MG/3DAYS TD PT72
MEDICATED_PATCH | TRANSDERMAL | Status: DC | PRN
  Administered 2022-01-15: 1 via TRANSDERMAL

## 2022-01-15 MED ORDER — LACTATED RINGERS IV SOLN
INTRAVENOUS | Status: DC
  Administered 2022-01-15: 1000 mL via INTRAVENOUS

## 2022-01-15 MED ORDER — FENTANYL CITRATE (PF) 100 MCG/2ML IJ SOLN
INTRAMUSCULAR | Status: AC
  Filled 2022-01-15: qty 2

## 2022-01-15 MED ORDER — PENICILLIN G POT IN DEXTROSE 60000 UNIT/ML IV SOLN
3.0000 10*6.[IU] | INTRAVENOUS | Status: DC
  Administered 2022-01-15 (×4): 3 10*6.[IU] via INTRAVENOUS
  Filled 2022-01-15 (×4): qty 50

## 2022-01-15 MED ORDER — FENTANYL-BUPIVACAINE-NACL 0.5-0.125-0.9 MG/250ML-% EP SOLN
12.0000 mL/h | EPIDURAL | Status: DC | PRN
  Administered 2022-01-15: 12 mL/h via EPIDURAL
  Filled 2022-01-15: qty 250

## 2022-01-15 MED ORDER — OXYTOCIN BOLUS FROM INFUSION: 333.0000 mL | Freq: Once | INTRAVENOUS | Status: DC

## 2022-01-15 MED ORDER — FENTANYL CITRATE (PF) 100 MCG/2ML IJ SOLN
INTRAMUSCULAR | Status: DC | PRN
  Administered 2022-01-15: 100 ug via EPIDURAL

## 2022-01-15 MED ORDER — OXYTOCIN-SODIUM CHLORIDE 30-0.9 UT/500ML-% IV SOLN
INTRAVENOUS | Status: AC
  Filled 2022-01-15: qty 500

## 2022-01-15 MED ORDER — NALOXONE HCL 4 MG/10ML IJ SOLN
1.0000 ug/kg/h | INTRAVENOUS | Status: DC | PRN
  Filled 2022-01-15: qty 5

## 2022-01-15 MED ORDER — PHENYLEPHRINE 40 MCG/ML (10ML) SYRINGE FOR IV PUSH (FOR BLOOD PRESSURE SUPPORT): 80.0000 ug | PREFILLED_SYRINGE | INTRAVENOUS | Status: DC | PRN

## 2022-01-15 MED ORDER — OXYTOCIN-SODIUM CHLORIDE 30-0.9 UT/500ML-% IV SOLN
INTRAVENOUS | Status: DC | PRN
  Administered 2022-01-15: 30 [IU] via INTRAVENOUS

## 2022-01-15 MED ORDER — BUPIVACAINE HCL (PF) 0.25 % IJ SOLN
INTRAMUSCULAR | Status: DC | PRN
Start: 2022-01-15 — End: 2022-01-15
  Administered 2022-01-15: 10 mL

## 2022-01-15 MED ORDER — NALOXONE HCL 0.4 MG/ML IJ SOLN: 0.4000 mg | INTRAMUSCULAR | Status: DC | PRN

## 2022-01-15 MED ORDER — ZOLPIDEM TARTRATE 5 MG PO TABS
5.0000 mg | ORAL_TABLET | Freq: Once | ORAL | Status: AC
  Administered 2022-01-15: 5 mg via ORAL
  Filled 2022-01-15: qty 1

## 2022-01-15 MED ORDER — FLEET ENEMA 7-19 GM/118ML RE ENEM: 1.0000 | ENEMA | RECTAL | Status: DC | PRN

## 2022-01-15 MED ORDER — DIPHENHYDRAMINE HCL 25 MG PO CAPS: 25.0000 mg | ORAL_CAPSULE | ORAL | Status: DC | PRN

## 2022-01-15 MED ORDER — STERILE WATER FOR IRRIGATION IR SOLN
Status: DC | PRN
Start: 2022-01-15 — End: 2022-01-15
  Administered 2022-01-15: 1000 mL

## 2022-01-15 MED ORDER — LIDOCAINE-EPINEPHRINE (PF) 2 %-1:200000 IJ SOLN
INTRAMUSCULAR | Status: DC | PRN
  Administered 2022-01-15: 3 mL via EPIDURAL
  Administered 2022-01-15: 10 mL via EPIDURAL

## 2022-01-15 MED ORDER — KETOROLAC TROMETHAMINE 30 MG/ML IJ SOLN: 30.0000 mg | Freq: Four times a day (QID) | INTRAMUSCULAR | Status: AC | PRN

## 2022-01-15 MED ORDER — PHENYLEPHRINE 80 MCG/ML (10ML) SYRINGE FOR IV PUSH (FOR BLOOD PRESSURE SUPPORT)
PREFILLED_SYRINGE | INTRAVENOUS | Status: DC | PRN
  Administered 2022-01-15: 120 ug via INTRAVENOUS
  Administered 2022-01-15 (×3): 80 ug via INTRAVENOUS

## 2022-01-15 MED ORDER — KETOROLAC TROMETHAMINE 30 MG/ML IJ SOLN
30.0000 mg | Freq: Four times a day (QID) | INTRAMUSCULAR | Status: AC | PRN
  Administered 2022-01-15: 30 mg via INTRAMUSCULAR

## 2022-01-15 MED ORDER — TERBUTALINE SULFATE 1 MG/ML IJ SOLN: 0.2500 mg | Freq: Once | INTRAMUSCULAR | Status: DC | PRN

## 2022-01-15 MED ORDER — LIDOCAINE-EPINEPHRINE (PF) 2 %-1:200000 IJ SOLN: INTRAMUSCULAR | Status: DC | PRN

## 2022-01-15 MED ORDER — LIDOCAINE-EPINEPHRINE (PF) 2 %-1:200000 IJ SOLN
INTRAMUSCULAR | Status: AC
  Filled 2022-01-15: qty 20

## 2022-01-15 MED ORDER — PHENYLEPHRINE HCL (PRESSORS) 10 MG/ML IV SOLN
INTRAVENOUS | Status: DC | PRN
Start: 2022-01-15 — End: 2022-01-15

## 2022-01-15 MED ORDER — MORPHINE SULFATE (PF) 0.5 MG/ML IJ SOLN
INTRAMUSCULAR | Status: AC
  Filled 2022-01-15: qty 10

## 2022-01-15 MED ORDER — ONDANSETRON HCL 4 MG/2ML IJ SOLN: 4.0000 mg | Freq: Once | INTRAMUSCULAR | Status: DC | PRN

## 2022-01-15 MED ORDER — SODIUM CHLORIDE 0.9% FLUSH: 3.0000 mL | INTRAVENOUS | Status: DC | PRN

## 2022-01-15 MED ORDER — OXYTOCIN-SODIUM CHLORIDE 30-0.9 UT/500ML-% IV SOLN: 1.0000 m[IU]/min | INTRAVENOUS | Status: DC

## 2022-01-15 MED ORDER — OXYTOCIN-SODIUM CHLORIDE 30-0.9 UT/500ML-% IV SOLN
1.0000 m[IU]/min | INTRAVENOUS | Status: DC
  Administered 2022-01-15: 2 m[IU]/min via INTRAVENOUS
  Filled 2022-01-15: qty 500

## 2022-01-15 MED ORDER — ONDANSETRON HCL 4 MG/2ML IJ SOLN: 4.0000 mg | Freq: Three times a day (TID) | INTRAMUSCULAR | Status: DC | PRN

## 2022-01-15 MED ORDER — EPHEDRINE 5 MG/ML INJ: 10.0000 mg | INTRAVENOUS | Status: DC | PRN

## 2022-01-15 MED ORDER — PHENYLEPHRINE 40 MCG/ML (10ML) SYRINGE FOR IV PUSH (FOR BLOOD PRESSURE SUPPORT)
PREFILLED_SYRINGE | INTRAVENOUS | Status: AC
  Filled 2022-01-15: qty 10

## 2022-01-15 MED ORDER — BUPIVACAINE HCL (PF) 0.25 % IJ SOLN
INTRAMUSCULAR | Status: AC
  Filled 2022-01-15: qty 10

## 2022-01-15 MED ORDER — MISOPROSTOL 25 MCG QUARTER TABLET
25.0000 ug | ORAL_TABLET | ORAL | Status: DC | PRN
  Administered 2022-01-15 (×2): 25 ug via VAGINAL
  Filled 2022-01-15 (×2): qty 1

## 2022-01-15 MED ORDER — ACETAMINOPHEN 325 MG PO TABS: 650.0000 mg | ORAL_TABLET | ORAL | Status: DC | PRN

## 2022-01-15 MED ORDER — LIDOCAINE HCL (PF) 1 % IJ SOLN: 30.0000 mL | INTRAMUSCULAR | Status: DC | PRN

## 2022-01-15 MED ORDER — MEPERIDINE HCL 25 MG/ML IJ SOLN: 6.2500 mg | INTRAMUSCULAR | Status: DC | PRN

## 2022-01-15 MED ORDER — LACTATED RINGERS IV SOLN: 500.0000 mL | Freq: Once | INTRAVENOUS | Status: DC

## 2022-01-15 MED ORDER — OXYTOCIN-SODIUM CHLORIDE 30-0.9 UT/500ML-% IV SOLN: 2.5000 [IU]/h | INTRAVENOUS | Status: DC

## 2022-01-15 MED ORDER — ACETAMINOPHEN 500 MG PO TABS: 1000.0000 mg | ORAL_TABLET | Freq: Four times a day (QID) | ORAL | Status: DC

## 2022-01-15 MED ORDER — MORPHINE SULFATE (PF) 0.5 MG/ML IJ SOLN
INTRAMUSCULAR | Status: DC | PRN
  Administered 2022-01-15: 3 mg via EPIDURAL

## 2022-01-15 MED ORDER — KETOROLAC TROMETHAMINE 30 MG/ML IJ SOLN
INTRAMUSCULAR | Status: AC
  Filled 2022-01-15: qty 1

## 2022-01-15 MED ORDER — DEXTROSE 5 % IV SOLN
INTRAVENOUS | Status: DC | PRN
  Administered 2022-01-15: 3 g via INTRAVENOUS

## 2022-01-15 MED ORDER — KETOROLAC TROMETHAMINE 30 MG/ML IJ SOLN: 30.0000 mg | Freq: Once | INTRAMUSCULAR | Status: AC | PRN

## 2022-01-15 MED ORDER — SOD CITRATE-CITRIC ACID 500-334 MG/5ML PO SOLN
30.0000 mL | ORAL | Status: DC | PRN
  Administered 2022-01-15: 30 mL via ORAL
  Filled 2022-01-15: qty 30

## 2022-01-15 MED ORDER — SCOPOLAMINE 1 MG/3DAYS TD PT72
MEDICATED_PATCH | TRANSDERMAL | Status: AC
  Filled 2022-01-15: qty 1

## 2022-01-15 MED ORDER — CEFAZOLIN IN SODIUM CHLORIDE 3-0.9 GM/100ML-% IV SOLN
INTRAVENOUS | Status: AC
  Filled 2022-01-15: qty 100

## 2022-01-15 MED ORDER — ONDANSETRON HCL 4 MG/2ML IJ SOLN
4.0000 mg | Freq: Four times a day (QID) | INTRAMUSCULAR | Status: DC | PRN
  Administered 2022-01-15 (×2): 4 mg via INTRAVENOUS
  Filled 2022-01-15 (×2): qty 2

## 2022-01-15 MED ORDER — DIPHENHYDRAMINE HCL 50 MG/ML IJ SOLN: 12.5000 mg | INTRAMUSCULAR | Status: DC | PRN

## 2022-01-15 MED ORDER — HYDROMORPHONE HCL 1 MG/ML IJ SOLN: 0.2500 mg | INTRAMUSCULAR | Status: DC | PRN

## 2022-01-15 MED ORDER — ONDANSETRON HCL 4 MG/2ML IJ SOLN
INTRAMUSCULAR | Status: AC
Start: 2022-01-15 — End: ?
  Filled 2022-01-15: qty 2

## 2022-01-15 MED ORDER — DEXAMETHASONE SODIUM PHOSPHATE 10 MG/ML IJ SOLN
INTRAMUSCULAR | Status: AC
  Filled 2022-01-15: qty 1

## 2022-01-15 MED ORDER — AMISULPRIDE (ANTIEMETIC) 5 MG/2ML IV SOLN: 10.0000 mg | Freq: Once | INTRAVENOUS | Status: DC | PRN

## 2022-01-15 MED ORDER — LACTATED RINGERS IV SOLN
500.0000 mL | INTRAVENOUS | Status: DC | PRN
  Administered 2022-01-15: 500 mL via INTRAVENOUS

## 2022-01-15 MED ORDER — OXYCODONE-ACETAMINOPHEN 5-325 MG PO TABS: 2.0000 | ORAL_TABLET | ORAL | Status: DC | PRN

## 2022-01-15 MED ORDER — OXYCODONE HCL 5 MG PO TABS: 5.0000 mg | ORAL_TABLET | Freq: Once | ORAL | Status: DC | PRN

## 2022-01-15 MED ORDER — OXYCODONE HCL 5 MG/5ML PO SOLN: 5.0000 mg | Freq: Once | ORAL | Status: DC | PRN

## 2022-01-15 MED ORDER — SCOPOLAMINE 1 MG/3DAYS TD PT72: 1.0000 | MEDICATED_PATCH | Freq: Once | TRANSDERMAL | Status: DC

## 2022-01-15 MED ORDER — OXYCODONE-ACETAMINOPHEN 5-325 MG PO TABS: 1.0000 | ORAL_TABLET | ORAL | Status: DC | PRN

## 2022-01-15 MED ORDER — DEXAMETHASONE SODIUM PHOSPHATE 10 MG/ML IJ SOLN
INTRAMUSCULAR | Status: DC | PRN
  Administered 2022-01-15: 10 mg via INTRAVENOUS

## 2022-01-15 MED ORDER — SODIUM CHLORIDE 0.9 % IV SOLN
5.0000 10*6.[IU] | Freq: Once | INTRAVENOUS | Status: AC
  Administered 2022-01-15: 5 10*6.[IU] via INTRAVENOUS
  Filled 2022-01-15: qty 5

## 2022-01-15 MED ORDER — LIDOCAINE HCL (PF) 1 % IJ SOLN
INTRAMUSCULAR | Status: DC | PRN
  Administered 2022-01-15: 5 mL via EPIDURAL
  Administered 2022-01-15: 3 mL via EPIDURAL
  Administered 2022-01-15: 2 mL via EPIDURAL

## 2022-01-15 MED ORDER — ONDANSETRON HCL 4 MG/2ML IJ SOLN
INTRAMUSCULAR | Status: DC | PRN
  Administered 2022-01-15: 4 mg via INTRAVENOUS

## 2022-01-15 SURGICAL SUPPLY — 39 items
BENZOIN TINCTURE PRP APPL 2/3 (GAUZE/BANDAGES/DRESSINGS) ×1 IMPLANT
CHLORAPREP W/TINT 26ML (MISCELLANEOUS) ×4 IMPLANT
CLAMP CORD UMBIL (MISCELLANEOUS) ×2 IMPLANT
CLOTH BEACON ORANGE TIMEOUT ST (SAFETY) ×2 IMPLANT
DRSG OPSITE POSTOP 4X10 (GAUZE/BANDAGES/DRESSINGS) ×2 IMPLANT
ELECT REM PT RETURN 9FT ADLT (ELECTROSURGICAL) ×2
ELECTRODE REM PT RTRN 9FT ADLT (ELECTROSURGICAL) ×1 IMPLANT
EXTRACTOR VACUUM KIWI (MISCELLANEOUS) IMPLANT
GLOVE BIOGEL PI IND STRL 7.0 (GLOVE) ×1 IMPLANT
GLOVE BIOGEL PI INDICATOR 7.0 (GLOVE) ×1
GLOVE SURG LTX SZ8 (GLOVE) ×2 IMPLANT
GOWN STRL REUS W/TWL LRG LVL3 (GOWN DISPOSABLE) ×4 IMPLANT
KIT ABG SYR 3ML LUER SLIP (SYRINGE) IMPLANT
MAT PREVALON FULL STRYKER (MISCELLANEOUS) ×1 IMPLANT
NDL HYPO 25X5/8 SAFETYGLIDE (NEEDLE) IMPLANT
NDL SPNL 20GX3.5 QUINCKE YW (NEEDLE) IMPLANT
NEEDLE HYPO 22GX1.5 SAFETY (NEEDLE) ×2 IMPLANT
NEEDLE HYPO 25X5/8 SAFETYGLIDE (NEEDLE) IMPLANT
NEEDLE SPNL 20GX3.5 QUINCKE YW (NEEDLE) IMPLANT
NS IRRIG 1000ML POUR BTL (IV SOLUTION) ×2 IMPLANT
PACK C SECTION WH (CUSTOM PROCEDURE TRAY) ×2 IMPLANT
RETRACTOR TRAXI PANNICULUS (MISCELLANEOUS) IMPLANT
STRIP CLOSURE SKIN 1/2X4 (GAUZE/BANDAGES/DRESSINGS) ×1 IMPLANT
SUT MNCRL 0 VIOLET CTX 36 (SUTURE) ×2 IMPLANT
SUT MNCRL AB 3-0 PS2 27 (SUTURE) IMPLANT
SUT MON AB 2-0 CT1 27 (SUTURE) ×2 IMPLANT
SUT MON AB-0 CT1 36 (SUTURE) ×4 IMPLANT
SUT MONOCRYL 0 CTX 36 (SUTURE) ×2
SUT PLAIN 0 NONE (SUTURE) IMPLANT
SUT PLAIN 2 0 (SUTURE)
SUT PLAIN 2 0 XLH (SUTURE) ×1 IMPLANT
SUT PLAIN ABS 2-0 CT1 27XMFL (SUTURE) IMPLANT
SUT VIC AB 4-0 KS 27 (SUTURE) ×1 IMPLANT
SYR 20CC LL (SYRINGE) IMPLANT
SYR CONTROL 10ML LL (SYRINGE) ×2 IMPLANT
TOWEL OR 17X24 6PK STRL BLUE (TOWEL DISPOSABLE) ×2 IMPLANT
TRAXI PANNICULUS RETRACTOR (MISCELLANEOUS) ×1
TRAY FOLEY W/BAG SLVR 14FR LF (SET/KITS/TRAYS/PACK) ×2 IMPLANT
WATER STERILE IRR 1000ML POUR (IV SOLUTION) ×2 IMPLANT

## 2022-01-15 NOTE — Anesthesia Procedure Notes (Signed)
Epidural ?Patient location during procedure: OB ?Start time: 01/15/2022 8:55 AM ?End time: 01/15/2022 9:07 AM ? ?Staffing ?Anesthesiologist: Suzette Battiest, MD ?Performed: anesthesiologist  ? ?Preanesthetic Checklist ?Completed: patient identified, IV checked, site marked, risks and benefits discussed, surgical consent, monitors and equipment checked, pre-op evaluation and timeout performed ? ?Epidural ?Patient position: sitting ?Prep: DuraPrep and site prepped and draped ?Patient monitoring: continuous pulse ox and blood pressure ?Approach: midline ?Location: L3-L4 ?Injection technique: LOR air ? ?Needle:  ?Needle type: Tuohy  ?Needle gauge: 17 G ?Needle length: 9 cm and 9 ?Needle insertion depth: 8 cm ?Catheter type: closed end flexible ?Catheter size: 19 Gauge ?Catheter at skin depth: 15 (14-->15) cm ?Test dose: negative ? ?Assessment ?Events: blood not aspirated, injection not painful, no injection resistance, no paresthesia and negative IV test ? ? ? ?

## 2022-01-15 NOTE — Progress Notes (Signed)
S: ?Comfortable with epidural. Discussed the R/B/A of AROM for labor induction and internal monitor placement and patient consents to procedure. Partner, Donte, present and supportive. Eagerly anticipating a baby girl "[name]".  ? ?O: ?Vitals:  ? 01/15/22 1130 01/15/22 1200 01/15/22 1230 01/15/22 1300  ?BP: 140/85 134/79 140/83 (!) 135/59  ?Pulse: 100 82 94 95  ?Resp: 18 18 16 16   ?Temp:    97.8 ?F (36.6 ?C)  ?TempSrc:    Oral  ?SpO2: 100% 100% 100%   ?Weight:      ?Height:      ? ?FHT:  FHR: 125 bpm, variability: moderate,  accelerations:  Present,  decelerations:  Absent ?UC:   irregular, every 2-5 minutes ?SVE:   Dilation: 5 ?Effacement (%): 50 ?Station: -3 ?Exam by:: [email or web address] CNM ? ?AROM of a small amount of blood tinged fluid at 1257. IUPC and FSE placed without difficulty.  ? ?A / P: ?Induction of labor due to chronic hypertension and BMI, s/p Cytotec overnight and Foley balloon, now expelled ? ?Fetal Wellbeing:  Category I ?Pain Control:  Epidural ?Anticipated MOD:  NSVD ? ?Will continue to increase Pitocin 2x2 until adequate MVUs.  ? ?Dr. Dione Plover updated on patient status and plan of care.  ? ?Billy Coast, MSN ?01/15/2022, 1:28 PM ? ?   ?

## 2022-01-15 NOTE — Transfer of Care (Signed)
Immediate Anesthesia Transfer of Care Note ? ?Patient: Catherine Becker ? ?Procedure(s) Performed: CESAREAN SECTION ? ?Patient Location: PACU ? ?Anesthesia Type:Epidural ? ?Level of Consciousness: awake, alert  and oriented ? ?Airway & Oxygen Therapy: Patient Spontanous Breathing ? ?Post-op Assessment: Report given to RN and Post -op Vital signs reviewed and stable ? ?Post vital signs: Reviewed and stable ? ?Last Vitals:  ?Vitals Value Taken Time  ?BP 123/68 01/15/22 2245  ?Temp    ?Pulse 124 01/15/22 2248  ?Resp 21 01/15/22 2248  ?SpO2 79 % 01/15/22 2248  ?Vitals shown include unvalidated device data. ? ?Last Pain:  ?Vitals:  ? 01/15/22 2107  ?TempSrc: Oral  ?PainSc:   ?   ? ?  ? ?Complications: No notable events documented. ?

## 2022-01-15 NOTE — Progress Notes (Signed)
Catherine Becker is a 26 y.o. G2P0010 at [redacted]w[redacted]d by LMP admitted for induction of labor due to Hypertension. ? ?Subjective: ?Feeling crampy ? ?Objective: ?BP 112/60   Pulse 99   Temp 97.7 ?F (36.5 ?C) (Oral)   Resp 18   Ht 5\' 5"  (1.651 m)   Wt (!) 140.6 kg   LMP 04/15/2021   SpO2 100%   BMI 51.59 kg/m?  ?I/O last 3 completed shifts: ?In: -  ?Out: 2100 [Urine:2100] ?No intake/output data recorded. ? ?FHT:  FHR: 145 bpm, variability: moderate,  accelerations:  Present,  decelerations:  Absent ?UC:   q 2-3 min, MVU 200-300 since IUPC placement ?SVE:   Dilation: 4 ?Effacement (%): 50 ?Station: -2 ?Exam by:: [email or web address], MD ? ? ?Labs: ?Lab Results  ?Component Value Date  ? WBC 11.5 (H) 01/15/2022  ? HGB 12.6 01/15/2022  ? HCT 36.7 01/15/2022  ? MCV 91.1 01/15/2022  ? PLT 299 01/15/2022  ? ? ?Assessment / Plan ?39wk IUP ?? Chtn ?BMI > 40 ?Active phase arrest ? ?Labor: no progress despite IUPC and Pitocin ?Preeclampsia:  no signs or symptoms of toxicity ?Fetal Wellbeing:  Category I ?Pain Control:  Epidural ?I/D:  n/a ?Anticipated MOD:  Proceed with csection. OR notified. Surgical consent done.  ?Risks vs benefits of surgery discussed.  ? ?Catherine Becker ?01/15/2022, 9:24 PM ? ? ?

## 2022-01-15 NOTE — H&P (Addendum)
Catherine Becker is a 26 y.o. female presenting for IOL for ? Chtn and BMI .>40 and inc maternal anxiety. ?OB History   ? ? Gravida  ?2  ? Para  ?   ? Term  ?   ? Preterm  ?   ? AB  ?1  ? Living  ?   ?  ? ? SAB  ?   ? IAB  ?1  ? Ectopic  ?   ? Multiple  ?   ? Live Births  ?   ?   ?  ?  ? ?Past Medical History:  ?Diagnosis Date  ? Anxiety   ? Depression   ? Trichimoniasis   ? ?Past Surgical History:  ?Procedure Laterality Date  ? DILATION AND CURETTAGE OF UTERUS    ? WISDOM TOOTH EXTRACTION    ? ?Family History: family history includes Cancer in her maternal grandfather and paternal grandmother; Healthy in her father and mother. ?Social History:  reports that she has never smoked. She has never used smokeless tobacco. She reports that she does not currently use alcohol after a past usage of about 1.0 standard drink per week. She reports that she does not use drugs. ? ? ?Normal  ?Maternal Diabetes: No ?Genetic Screening: Normal ?Maternal Ultrasounds/Referrals: Normal ?Fetal Ultrasounds or other Referrals:  None ?Maternal Substance Abuse:  No ?Significant Maternal Medications:  Meds include: Other:  ?Significant Maternal Lab Results:  Group B Strep positive ?Other Comments:  None ? ?Review of Systems ?History ?Dilation: 1.5 ?Effacement (%): 50 ?Station: -3 ?Exam by:: Catherine Nutting, RN ?Blood pressure 140/88, pulse 97, temperature 98.3 ?F (36.8 ?C), temperature source Oral, resp. rate (!) 21, height 5\' 5"  (1.651 m), weight (!) 140.6 kg, last menstrual period 04/15/2021. ?Maternal Exam:  ?Introitus: Normal vulva.  ?Physical Exam ?Constitutional:   ?   Appearance: Normal appearance. She is obese.  ?HENT:  ?   Head: Normocephalic and atraumatic.  ?Cardiovascular:  ?   Rate and Rhythm: Normal rate.  ?   Pulses: Normal pulses.  ?   Heart sounds: Normal heart sounds.  ?Pulmonary:  ?   Effort: Pulmonary effort is normal.  ?   Breath sounds: Normal breath sounds.  ?Abdominal:  ?   General: Bowel sounds are normal.  ?   Palpations:  Abdomen is soft.  ?Genitourinary: ?   General: Normal vulva.  ?Musculoskeletal:     ?   General: Normal range of motion.  ?   Cervical back: Normal range of motion and neck supple.  ?Skin: ?   General: Skin is warm.  ?Neurological:  ?   General: No focal deficit present.  ?   Mental Status: She is alert. Mental status is at baseline.  ?Psychiatric:     ?   Mood and Affect: Mood normal.  ?  ?Prenatal labs: ?ABO, Rh: --/--/O POS (04/17 [id number]) ?Antibody: NEG (04/17 0043) ?Rubella: Immune (09/28 0000) ?RPR: Nonreactive (09/28 0000)  ?HBsAg: Negative (09/28 0000)  ?HIV: Non-reactive (09/28 0000)  ?GBS: Positive/-- (03/22 0000)  ? ?Assessment/Plan: ?39 + week IUP ?GBS pos ?BMI > 40 ?CHTN ?IOL, IV abx ? ?Catherine Becker J ?01/15/2022, 6:31 AM ? ? ? ? ?

## 2022-01-15 NOTE — Anesthesia Preprocedure Evaluation (Signed)
Anesthesia Evaluation  ?Patient identified by MRN, date of birth, ID band ?Patient awake ? ? ? ?Reviewed: ?Allergy & Precautions, Patient's Chart, lab work & pertinent test results ? ?Airway ?Mallampati: III ? ?TM Distance: >3 FB ? ? ? ? Dental ?  ?Pulmonary ?neg pulmonary ROS,  ?  ?breath sounds clear to auscultation ? ? ? ? ? ? Cardiovascular ?hypertension,  ?Rhythm:Regular Rate:Normal ? ? ?  ?Neuro/Psych ?negative neurological ROS ?   ? GI/Hepatic ?negative GI ROS, Neg liver ROS,   ?Endo/Other  ?Morbid obesity ? Renal/GU ?negative Renal ROS  ? ?  ?Musculoskeletal ? ? Abdominal ?  ?Peds ? Hematology ?negative hematology ROS ?(+)   ?Anesthesia Other Findings ? ? Reproductive/Obstetrics ?(+) Pregnancy ? ?  ? ? ? ? ? ? ? ? ? ? ? ? ? ?  ?  ? ? ? ? ? ? ? ? ?Lab Results  ?Component Value Date  ? WBC 11.5 (H) 01/15/2022  ? HGB 12.6 01/15/2022  ? HCT 36.7 01/15/2022  ? MCV 91.1 01/15/2022  ? PLT 299 01/15/2022  ? ? ?Anesthesia Physical ?Anesthesia Plan ? ?ASA: 3 ? ?Anesthesia Plan: Epidural  ? ?Post-op Pain Management:   ? ?Induction:  ? ?PONV Risk Score and Plan:  ? ?Airway Management Planned: Natural Airway ? ?Additional Equipment:  ? ?Intra-op Plan:  ? ?Post-operative Plan:  ? ?Informed Consent: I have reviewed the patients History and Physical, chart, labs and discussed the procedure including the risks, benefits and alternatives for the proposed anesthesia with the patient or authorized representative who has indicated his/her understanding and acceptance.  ? ? ? ? ? ?Plan Discussed with:  ? ?Anesthesia Plan Comments:   ? ? ? ? ? ? ?Anesthesia Quick Evaluation ? ?

## 2022-01-15 NOTE — Progress Notes (Signed)
Catherine Becker is a 26 y.o. G2P0010 at [redacted]w[redacted]d by LMP admitted for induction of labor due to Hypertension. ? ?Subjective: ?Feeling crampy ? ?Objective: ?BP 120/71   Pulse 99   Temp 97.8 ?F (36.6 ?C) (Oral)   Resp 18   Ht 5\' 5"  (1.651 m)   Wt (!) 140.6 kg   LMP 04/15/2021   SpO2 100%   BMI 51.59 kg/m?  ?No intake/output data recorded. ?No intake/output data recorded. ? ?FHT:  FHR: 145 bpm, variability: moderate,  accelerations:  Present,  decelerations:  Absent ?UC:   irregular, every 3 minutes ?SVE:   Dilation: 2.5 ?Effacement (%): 60 ?Station: -2 ?Exam by:: Dr. Ronita Hipps ?Foley placed in standard fashion intracervically without complications ? ?Labs: ?Lab Results  ?Component Value Date  ? WBC 11.5 (H) 01/15/2022  ? HGB 12.6 01/15/2022  ? HCT 36.7 01/15/2022  ? MCV 91.1 01/15/2022  ? PLT 299 01/15/2022  ? ? ?Assessment / Plan ?39wk IUP ?? Chtn ?BMI > 40 ? ?Labor: Progressing normally ?Preeclampsia:  no signs or symptoms of toxicity ?Fetal Wellbeing:  Category I ?Pain Control:  Epidural ?I/D:  n/a ?Anticipated MOD:  NSVD ? ?Catherine Becker ?01/15/2022, 9:55 AM ? ? ?

## 2022-01-15 NOTE — Anesthesia Postprocedure Evaluation (Signed)
Anesthesia Post Note ? ?Patient: Catherine Becker ? ?Procedure(s) Performed: CESAREAN SECTION ? ?  ? ?Patient location during evaluation: PACU ?Anesthesia Type: Epidural ?Level of consciousness: awake and alert and oriented ?Pain management: pain level controlled ?Vital Signs Assessment: post-procedure vital signs reviewed and stable ?Respiratory status: spontaneous breathing, nonlabored ventilation and respiratory function stable ?Cardiovascular status: blood pressure returned to baseline and stable ?Postop Assessment: no headache, no backache, patient able to bend at knees and epidural receding ?Anesthetic complications: no ? ? ?No notable events documented. ? ?Last Vitals:  ?Vitals:  ? 01/15/22 2330 01/15/22 2345  ?BP: 110/75 127/71  ?Pulse: 95 73  ?Resp: 20 16  ?Temp: 37 ?C   ?SpO2: 100% 94%  ?  ?Last Pain:  ?Vitals:  ? 01/15/22 2345  ?TempSrc:   ?PainSc: 0-No pain  ? ?Pain Goal:   ? ?LLE Motor Response: Purposeful movement (01/15/22 2345) ?LLE Sensation: Numbness (01/15/22 2345) ?RLE Motor Response: Purposeful movement (01/15/22 2345) ?RLE Sensation: Numbness (01/15/22 2345) ?  ?  ?Epidural/Spinal Function Cutaneous sensation: Tingles (01/15/22 2345), Patient able to flex knees: No (01/15/22 2345), Patient able to lift hips off bed: No (01/15/22 2345), Back pain beyond tenderness at insertion site: No (01/15/22 2345), Progressively worsening motor and/or sensory loss: No (01/15/22 2345), Bowel and/or bladder incontinence post epidural: No (01/15/22 2345) ? ?Tennis Must Tristian Bouska ? ? ? ? ?

## 2022-01-15 NOTE — Op Note (Signed)
Cesarean Section Procedure Note ? ?Indications: failure to progress: arrest of dilation ? ?Pre-operative Diagnosis: 39 week 2 day pregnancy. ? ?Post-operative Diagnosis: same plus CPD ? ?Surgeon: Lovenia Kim  ? ?Assistants: Ronnald Ramp, CNM ? ?Anesthesia: Epidural anesthesia and Local anesthesia 0.25.% bupivacaine ? ?ASA Class: 2 ? ?Procedure Details  ?The patient was seen in the Holding Room. The risks, benefits, complications, treatment options, and expected outcomes were discussed with the patient.  The patient concurred with the proposed plan, giving informed consent. The risks of anesthesia, infection, bleeding and possible injury to other organs discussed. Injury to bowel, bladder, or ureter with possible need for repair discussed. Possible need for transfusion with secondary risks of hepatitis or HIV acquisition discussed. Post operative complications to include but not limited to DVT, PE and Pneumonia noted. The site of surgery properly noted/marked. The patient was taken to Operating Room # B, identified as Catherine Becker and the procedure verified as C-Section Delivery. A Time Out was held and the above information confirmed. ? ?After induction of anesthesia, the patient was draped and prepped in the usual sterile manner. A Pfannenstiel incision was made and carried down through the subcutaneous tissue to the fascia. Fascial incision was made and extended transversely using Mayo scissors. The fascia was separated from the underlying rectus tissue superiorly and inferiorly. The peritoneum was identified and entered. Peritoneal incision was extended longitudinally. The utero-vesical peritoneal reflection was incised transversely and the bladder flap was bluntly freed from the lower uterine segment. A low transverse uterine incision(Kerr hysterotomy) was made. Delivered from OA presentation was a  female with Apgar scores of 8 at one minute and 9 at five minutes. Bulb suctioning gently performed. Neonatal team  in attendance.After the umbilical cord was clamped and cut cord blood was obtained for evaluation. The placenta was removed intact and appeared normal. The uterus was curetted with a dry lap pack. Good hemostasis was noted.The uterine outline, tubes and ovaries appeared normal. The uterine incision was closed with running locked sutures of 0 Monocryl x 2 layers. Hemostasis was observed. The fascia was then reapproximated with running sutures of 0 Monocryl. The skin was reapproximated with 4-0 vicryl after  closure with 2-0 plain. ? ?Instrument, sponge, and needle counts were correct prior the abdominal closure and at the conclusion of the case.  ? ?Findings: ?FTLF, vertex at pelvic brim, post placenta, nl adnesa ? ?Estimated Blood Loss:  300 mL ?        ?Drains: foley ?        ?        ?Specimens: placenta ?        ?        ?Complications:  None; patient tolerated the procedure well. ?        ?Disposition: PACU - hemodynamically stable. ?        ?Condition: stable ? ?Attending Attestation: I performed the procedure. ? ? ? ?  ?

## 2022-01-16 ENCOUNTER — Encounter (HOSPITAL_COMMUNITY): Payer: Self-pay | Admitting: Obstetrics and Gynecology

## 2022-01-16 MED ORDER — WITCH HAZEL-GLYCERIN EX PADS: 1.0000 "application " | MEDICATED_PAD | CUTANEOUS | Status: DC | PRN

## 2022-01-16 MED ORDER — SIMETHICONE 80 MG PO CHEW
80.0000 mg | CHEWABLE_TABLET | Freq: Three times a day (TID) | ORAL | Status: DC
  Administered 2022-01-16 – 2022-01-17 (×3): 80 mg via ORAL
  Filled 2022-01-16 (×4): qty 1

## 2022-01-16 MED ORDER — METHYLERGONOVINE MALEATE 0.2 MG PO TABS: 0.2000 mg | ORAL_TABLET | ORAL | Status: DC | PRN

## 2022-01-16 MED ORDER — OXYTOCIN-SODIUM CHLORIDE 30-0.9 UT/500ML-% IV SOLN: 2.5000 [IU]/h | INTRAVENOUS | Status: AC

## 2022-01-16 MED ORDER — METHYLERGONOVINE MALEATE 0.2 MG/ML IJ SOLN: 0.2000 mg | INTRAMUSCULAR | Status: DC | PRN

## 2022-01-16 MED ORDER — IBUPROFEN 600 MG PO TABS
600.0000 mg | ORAL_TABLET | Freq: Four times a day (QID) | ORAL | Status: DC
  Administered 2022-01-16 – 2022-01-17 (×3): 600 mg via ORAL
  Filled 2022-01-16 (×5): qty 1

## 2022-01-16 MED ORDER — DIBUCAINE (PERIANAL) 1 % EX OINT: 1.0000 "application " | TOPICAL_OINTMENT | CUTANEOUS | Status: DC | PRN

## 2022-01-16 MED ORDER — DIPHENHYDRAMINE HCL 25 MG PO CAPS: 25.0000 mg | ORAL_CAPSULE | Freq: Four times a day (QID) | ORAL | Status: DC | PRN

## 2022-01-16 MED ORDER — OXYCODONE HCL 5 MG PO TABS: 5.0000 mg | ORAL_TABLET | ORAL | Status: DC | PRN

## 2022-01-16 MED ORDER — MENTHOL 3 MG MT LOZG: 1.0000 | LOZENGE | OROMUCOSAL | Status: DC | PRN

## 2022-01-16 MED ORDER — TETANUS-DIPHTH-ACELL PERTUSSIS 5-2.5-18.5 LF-MCG/0.5 IM SUSY: 0.5000 mL | PREFILLED_SYRINGE | Freq: Once | INTRAMUSCULAR | Status: DC

## 2022-01-16 MED ORDER — COCONUT OIL OIL: 1.0000 "application " | TOPICAL_OIL | Status: DC | PRN

## 2022-01-16 MED ORDER — SIMETHICONE 80 MG PO CHEW: 80.0000 mg | CHEWABLE_TABLET | ORAL | Status: DC | PRN

## 2022-01-16 MED ORDER — PRENATAL MULTIVITAMIN CH
1.0000 | ORAL_TABLET | Freq: Every day | ORAL | Status: DC
  Administered 2022-01-16 – 2022-01-17 (×2): 1 via ORAL
  Filled 2022-01-16 (×2): qty 1

## 2022-01-16 MED ORDER — BUSPIRONE HCL 15 MG PO TABS
7.5000 mg | ORAL_TABLET | Freq: Two times a day (BID) | ORAL | Status: DC
  Administered 2022-01-16 – 2022-01-17 (×3): 7.5 mg via ORAL
  Filled 2022-01-16 (×7): qty 1

## 2022-01-16 MED ORDER — ACETAMINOPHEN 500 MG PO TABS
1000.0000 mg | ORAL_TABLET | Freq: Four times a day (QID) | ORAL | Status: DC
  Administered 2022-01-16 – 2022-01-17 (×6): 1000 mg via ORAL
  Filled 2022-01-16 (×6): qty 2

## 2022-01-16 MED ORDER — KETOROLAC TROMETHAMINE 30 MG/ML IJ SOLN
30.0000 mg | Freq: Four times a day (QID) | INTRAMUSCULAR | Status: AC
  Filled 2022-01-16 (×2): qty 1

## 2022-01-16 MED ORDER — ZOLPIDEM TARTRATE 5 MG PO TABS: 5.0000 mg | ORAL_TABLET | Freq: Every evening | ORAL | Status: DC | PRN

## 2022-01-16 MED ORDER — SENNOSIDES-DOCUSATE SODIUM 8.6-50 MG PO TABS
2.0000 | ORAL_TABLET | Freq: Every day | ORAL | Status: DC
  Administered 2022-01-16 – 2022-01-17 (×2): 2 via ORAL
  Filled 2022-01-16 (×2): qty 2

## 2022-01-16 NOTE — Progress Notes (Signed)
Subjective: ?POD# 1 ?Live born female  ?Birth Weight: 7 lb 2.6 oz (3249 g) ?APGAR: 8, 9 ? ?Newborn Delivery   ?Birth date/time: 01/15/2022 22:07:00 ?Delivery type: C-Section, Low Transverse ?C-section categorization: Primary ?  ?  ?Baby name: Ovid Curd ? ?Delivering provider: Brien Few  ? ?Feeding: breast ? ?Pain control at delivery: Epidural;Spinal  ? ?Reports feeling well. Minimal pain. Ambulating in room without assistance. Mother at the bedside.  ? ?Patient reports tolerating PO.   ?Pain controlled with acetaminophen and prescription NSAID's including ketorolac (Toradol) ?Denies HA/SOB/C/P/N/V/dizziness. ?She reports vaginal bleeding as normal, without clots. She is ambulating and urinating without difficulty.    ? ?Objective:  ?Vitals:  ? 01/16/22 0416 01/16/22 0419 01/16/22 0605 01/16/22 0808  ?BP: 114/72 109/61  115/78  ?Pulse: 65 90  84  ?Resp:   18 18  ?Temp:    98.2 ?F (36.8 ?C)  ?TempSrc:    Oral  ?SpO2:   100% 100%  ?Weight:      ?Height:      ?  ?Intake/Output Summary (Last 24 hours) at 01/16/2022 0827 ?Last data filed at 01/16/2022 [id number] ?Gross per 24 hour  ?Intake 1610 ml  ?Output 3578 ml  ?Net -1968 ml  ?    ?Recent Labs  ?  01/15/22 ?[id number] 01/15/22 ?0805  ?WBC 11.6* 11.5*  ?HGB 13.4 12.6  ?HCT 38.8 36.7  ?PLT 344 299  ? ? Blood type: --/--/O POS (04/17 [id number]) ? Rubella: Immune (09/28 0000)  ? ?Physical Exam:  ?General: alert and cooperative ?CV: Regular rate and rhythm ?Resp: clear ?Abdomen: soft, nontender, normal bowel sounds ?Incision: clean, dry, and intact ?Uterine Fundus: firm, below umbilicus, nontender ?Lochia: minimal ?Ext: edema 1+ non-pitting edema ? ?Assessment/Plan: ?26 y.o.   POD# 1. [id number]  ?                ?Principal Problem: ?  Postpartum care following cesarean delivery 4/17 ? Encourage rest when baby rests ?Breastfeeding support ?Encourage to ambulate ?Routine post-op care ?Active Problems: ?  Chronic hypertension affecting pregnancy ? Labs stable ? BPs WNL ? No current meds ? Denies  PEC symptoms  ?  Failed induction of labor, AOD ?  Status post primary low transverse cesarean section 4/17 ?  Morbid obesity with BMI of 50.0-59.9 ? Encourage ambulation ?  Positive GBS test ? Treated adequately in labor ?  Anxiety and depression ? Continue Buspar 7.5mg  ? Close PP F/U for mood ? ?Desires early discharge tomorrow. ? ?Zettie Pho, MSN ?01/16/2022, 8:27 AM ? ?  ?

## 2022-01-16 NOTE — Lactation Note (Signed)
This note was copied from a baby's chart. ?Lactation Consultation Note ? ?Patient Name: Catherine Becker ?Today's Date: 01/16/2022 ?Reason for consult: Initial assessment;1st time breastfeeding ?Age:24 hours ? ?P1, Mother had questions about her milk supply. ?Discussed supply and demand.  Mother has hand expressed and viewed drops and states baby has been latching well.  Mother and baby sleepy.  Suggest calling later today for latch assistance as needed. ?Feed on demand with cues.  Goal 8-12+ times per day after first 24 hrs.   ?Mom made aware of O/P services, breastfeeding support groups, and our phone # for post-discharge questions.  ? ?Maternal Data ?Has patient been taught Hand Expression?: Yes ?Does the patient have breastfeeding experience prior to this delivery?: No ? ?Feeding ?Mother's Current Feeding Choice: Breast Milk ? ?LATCH Score ?Latch: Repeated attempts needed to sustain latch, nipple held in mouth throughout feeding, stimulation needed to elicit sucking reflex. ? ?Audible Swallowing: A few with stimulation ? ?Type of Nipple: Everted at rest and after stimulation ? ?Comfort (Breast/Nipple): Soft / non-tender ? ?Hold (Positioning): No assistance needed to correctly position infant at breast. ? ?LATCH Score: 8 ? ? ?Interventions ?Interventions: Education;LC Services brochure ? ?Consult Status ?Consult Status: Follow-up ?Date: 01/16/22 ?Follow-up type: In-patient ? ? ? ?Dahlia Byes Boschen ?01/16/2022, 10:21 AM ? ? ? ?

## 2022-01-16 NOTE — Progress Notes (Signed)
MOB was referred for history of depression/anxiety. ?* Referral screened out by Clinical Social Worker because none of the following criteria appear to apply: ?~ History of anxiety/depression during this pregnancy, or of post-partum depression following prior delivery. ?~ Diagnosis of anxiety and/or depression within last 3 years ?OR ?* MOB's symptoms currently being treated with medication and/or therapy. Per chart review, MOB is currently prescribed/taking Buspar and seeing a therapist.  ?Please contact the Clinical Social Worker if needs arise, by [facility] request, or if MOB scores greater than 9/yes to question 10 on Edinburgh Postpartum Depression Screen. ? ?Celso Sickle, LCSW ?Clinical Social Worker ?[facility] ?Cell#: [phone] ? ?

## 2022-01-17 MED ORDER — OXYCODONE HCL 5 MG PO TABS: 5.0000 mg | ORAL_TABLET | Freq: Four times a day (QID) | ORAL | 0 refills | Status: AC | PRN

## 2022-01-17 MED ORDER — ACETAMINOPHEN 500 MG PO TABS
1000.0000 mg | ORAL_TABLET | Freq: Four times a day (QID) | ORAL | 0 refills | Status: AC
Start: 2022-01-17 — End: ?

## 2022-01-17 MED ORDER — IBUPROFEN 600 MG PO TABS: 600.0000 mg | ORAL_TABLET | Freq: Four times a day (QID) | ORAL | 0 refills | Status: AC

## 2022-01-17 NOTE — Discharge Instructions (Signed)
Lactation outpatient support - home visit ° ° °Jessica Bowers, IBCLC (lactation consultant)  & Birth Doula ° °Phone (text or call): [phone] °Email: [email or web address] °[email or web address] ° ° °Linda Coppola °RN, MHA, IBCLC °at [facility]: Lactation Consultant ° °[email or web address] °Mail: [email or web address] °Tel: [phone] ° ° °Additional breastfeeding resources: ° °[facility] °https://ibconline.ca/information-sheets/ ° °[employer] [place] ° °www.lllofnc.org ° ° °Other Resources: ° °Chiropractic specialist  ° °Dr. Leanna Hastings °[email or web address] ° ° °Craniosacral therapy for baby ° °Erin Balkind  °[email or web address] ° °

## 2022-01-17 NOTE — Discharge Summary (Signed)
?OB Discharge Summary ? ?Patient Name: Catherine Becker ?DOB: 06/07/1996 ?MRN: [id number] ? ?Date of admission: 01/15/2022 ?Delivering provider: Olivia Mackie  ? ?Admitting diagnosis: Chronic hypertension affecting pregnancy [O10.919] ?Intrauterine pregnancy: [redacted]w[redacted]d     ?Secondary diagnosis: ?Patient Active Problem List  ? Diagnosis Date Noted  ? Chronic hypertension affecting pregnancy 01/15/2022  ? Failed induction of labor, AOD 01/15/2022  ? Status post primary low transverse cesarean section 4/17 01/15/2022  ? Postpartum care following cesarean delivery 4/17 01/15/2022  ? Morbid obesity with BMI of 50.0-59.9 01/15/2022  ? Positive GBS test 01/15/2022  ? Anxiety and depression 01/15/2022  ? ?Additional problems:none  ? ?Date of discharge: 01/17/2022   ?Discharge diagnosis: Principal Problem: ?  Postpartum care following cesarean delivery 4/17 ?Active Problems: ?  Chronic hypertension affecting pregnancy ?  Failed induction of labor, AOD ?  Status post primary low transverse cesarean section 4/17 ?  Morbid obesity with BMI of 50.0-59.9 ?  Positive GBS test ?  Anxiety and depression ? ?                                                            ?Post partum procedures: none ? ?Augmentation: AROM, Pitocin, Cytotec, and IP Foley ?Pain control: Epidural;Spinal  ?Laceration:  ?Episiotomy:  ?Complications: None ? ?Hospital course:  Induction of Labor With Cesarean Section   ?26 y.o. yo G2P1011 at [redacted]w[redacted]d was admitted to the hospital 01/15/2022 for induction of labor. Patient had a labor course significant for induction of labor with cervical ripening using cervical balloon and misoprostol, then AROM and Pitocin with progression to 5 cm dilation. The patient went for cesarean section due to Arrest of Dilation. Delivery details are as follows: ?Membrane Rupture Time/Date: 12:57 PM ,01/15/2022   ?Delivery Method:C-Section, Low Transverse  ?Details of operation can be found in separate operative Note.  Patient had an uncomplicated  postpartum course. She is ambulating, tolerating a regular diet, passing flatus, and urinating well.  Patient is discharged home in stable condition on 01/17/22.     ? ?Newborn Data: ?Birth date:01/15/2022  ?Birth time:10:07 PM  ?Gender:Female  ?Living status:Living  ?Apgars:8 ,9  ?Weight:3249 g                               ? ?Physical exam  ?Vitals:  ? 01/16/22 1000 01/16/22 1201 01/16/22 2037 01/16/22 2300  ?BP:  116/77 (!) 99/58 110/75  ?Pulse:  84 75 76  ?Resp: 18 18 18    ?Temp:  97.6 ?F (36.4 ?C) 98.8 ?F (37.1 ?C)   ?TempSrc:  Oral Oral   ?SpO2: 98% 100% 100%   ?Weight:      ?Height:      ? ?General: alert, cooperative, and no distress ?Lochia: appropriate ?Uterine Fundus: firm ?Incision: Healing well with no significant drainage, Dressing is clean, dry, and intact ?DVT Evaluation: No cords or calf tenderness. ?No significant calf/ankle edema. ?Labs: ?Lab Results  ?Component Value Date  ? WBC 11.5 (H) 01/15/2022  ? HGB 12.6 01/15/2022  ? HCT 36.7 01/15/2022  ? MCV 91.1 01/15/2022  ? PLT 299 01/15/2022  ? ? ?  Latest Ref Rng & Units 11/13/2021  ?  6:59 PM  ?CMP  ?Glucose 70 - 99 mg/dL 93    ?  BUN 6 - 20 mg/dL 5    ?Creatinine 0.44 - 1.00 mg/dL 0.50    ?Sodium 135 - 145 mmol/L 136    ?Potassium 3.5 - 5.1 mmol/L 3.6    ?Chloride 98 - 111 mmol/L 107    ?CO2 22 - 32 mmol/L 20    ?Calcium 8.9 - 10.3 mg/dL 8.9    ?Total Protein 6.5 - 8.1 g/dL 6.1    ?Total Bilirubin 0.3 - 1.2 mg/dL 0.3    ?Alkaline Phos 38 - 126 U/L 90    ?AST 15 - 41 U/L 21    ?ALT 0 - 44 U/L 24    ? ? ?  01/16/2022  ?  5:52 PM  ?Edinburgh Postnatal Depression Scale Screening Tool  ?I have been able to laugh and see the funny side of things. 0  ?I have looked forward with enjoyment to things. 0  ?I have blamed myself unnecessarily when things went wrong. 2  ?I have been anxious or worried for no good reason. 1  ?I have felt scared or panicky for no good reason. 1  ?Things have been getting on top of me. 1  ?I have been so unhappy that I have had  difficulty sleeping. 0  ?I have felt sad or miserable. 0  ?I have been so unhappy that I have been crying. 0  ?The thought of harming myself has occurred to me. 0  ?Edinburgh Postnatal Depression Scale Total 5  ? ?Discharge instruction:  ?per After Visit Summary,  ?[facility] booklet and  ?"Understanding Mother & [place]" hospital booklet ? ?After Visit Meds:  ?Allergies as of 01/17/2022   ?No Known Allergies ?  ? ?  ?Medication List  ?  ? ?STOP taking these medications   ? ?escitalopram 5 MG tablet ?Commonly known as: Lexapro ?  ?folic acid 1 MG tablet ?Commonly known as: FOLVITE ?  ?hydrOXYzine 25 MG capsule ?Commonly known as: Vistaril ?  ? ?  ? ?TAKE these medications   ? ?acetaminophen 500 MG tablet ?Commonly known as: TYLENOL ?Take 2 tablets (1,000 mg total) by mouth every 6 (six) hours. ?  ?benzonatate 100 MG capsule ?Commonly known as: TESSALON ?Take 1 capsule (100 mg total) by mouth every 8 (eight) hours. ?What changed:  ?when to take this ?reasons to take this ?  ?busPIRone 7.5 MG tablet ?Commonly known as: BUSPAR ?Take 1 tablet (7.5 mg total) by mouth 2 (two) times daily. ?  ?cyclobenzaprine 10 MG tablet ?Commonly known as: FLEXERIL ?Take 1 tablet (10 mg total) by mouth 2 (two) times daily as needed for muscle spasms. ?  ?ibuprofen 600 MG tablet ?Commonly known as: ADVIL ?Take 1 tablet (600 mg total) by mouth every 6 (six) hours. ?  ?oxyCODONE 5 MG immediate release tablet ?Commonly known as: Oxy IR/ROXICODONE ?Take 1 tablet (5 mg total) by mouth every 6 (six) hours as needed for up to 5 days for moderate pain. ?  ?prenatal multivitamin Tabs tablet ?Take 1 tablet by mouth daily at 12 noon. ?  ? ?  ? ? ?Diet: routine diet ? ?Activity: Advance as tolerated. Pelvic rest for 6 weeks.  ? ?Postpartum contraception: TBA in office ? ?Newborn Data: ?Live born female  ?Birth Weight: 7 lb 2.6 oz (3249 g) ?APGAR: 8, 9 ? ?Newborn Delivery   ?Birth date/time: 01/15/2022 22:07:00 ?Delivery type: C-Section, Low  Transverse ?C-section categorization: Primary ?  ?  ? named Ovid Curd ?Baby Feeding: Bottle and Breast ?Disposition:home with mother ? ? ?Delivery  Report: ? ?Review the Delivery Report for details.   ? ?Follow up: ? Follow-up Information   ? ? Brien Few, MD. Schedule an appointment as soon as possible for a visit in 1 week(s).   ?Specialty: Obstetrics and Gynecology ?Why: For Postpartum follow-up / BP check ?Contact information: ?[address] ?[place] [address] ?[phone] ? ? ?  ?  ? ?  ?  ? ?  ? ? ? ? ?Signed: ?Otilio Carpen, MSN ?01/17/2022, 10:57 AM ?  ?

## 2022-01-17 NOTE — Lactation Note (Signed)
This note was copied from a baby's chart. ?Lactation Consultation Note ? ?Patient Name: Catherine Becker ?Today's Date: 01/17/2022 ?Reason for consult: Follow-up assessment;1st time breastfeeding;Primapara;Term ?Age:24 hours ? ?LC in to visit with [name] of term baby on day of discharge.  Baby is at 6% weight loss with good output.  Mom started supplemented overnight due to baby being fussy and not contented after breastfeeding.  Mom also c/o soreness with latch.   ? ?Baby in football hold latched onto nipple with a shallow latch.  Offered to assist.  Nipple pinched when baby came off. ? ?Pillow support and support of breast encouraged.  Baby opening her mouth wide, after a couple attempts with Mom sandwiching breast to accommodate a deeper latch.  Baby noted to suck with deep jaw extensions and swallows identified.  Mom was thrilled. ? ?Encouraged STS and watching baby for feeding cues, offering the breast with a deep latch.   ? ?Engorgement prevention and treatment reviewed  ?Mom encouraged to call prn for concerns. ? ?[facility] to go to [facility] and see IBCLC prn. ?  ? ?LATCH Score ?Latch: Grasps breast easily, tongue down, lips flanged, rhythmical sucking. (after a couple attempts) ? ?Audible Swallowing: Spontaneous and intermittent ? ?Type of Nipple: Everted at rest and after stimulation ? ?Comfort (Breast/Nipple): Filling, red/small blisters or bruises, mild/mod discomfort ? ?Hold (Positioning): Assistance needed to correctly position infant at breast and maintain latch. ? ?LATCH Score: 8 ? ? ?Lactation Tools Discussed/Used ?Breast pump type: Other (comment) (Mom's own hand's free pump) ?Reason for Pumping: Support milk supply ? ?Interventions ?Interventions: Skin to skin;Assisted with latch;Breast feeding basics reviewed;Breast massage;Hand express;Adjust position;Support pillows;Position options;Expressed milk;Education ? ?Discharge ?Discharge Education: Engorgement and breast care;Warning signs for feeding  baby ?Pump: Hands Free;Personal ?WIC Program: No ? ?Consult Status ?Consult Status: Complete ?Date: 01/17/22 ?Follow-up type: Call as needed ? ? ? ?Johny Blamer E ?01/17/2022, 10:37 AM ? ? ? ?

## 2022-01-18 ENCOUNTER — Encounter (INDEPENDENT_AMBULATORY_CARE_PROVIDER_SITE_OTHER): Payer: Self-pay

## 2022-01-18 ENCOUNTER — Ambulatory Visit (INDEPENDENT_AMBULATORY_CARE_PROVIDER_SITE_OTHER): Payer: BC Managed Care – PPO | Admitting: Primary Care

## 2022-01-24 ENCOUNTER — Telehealth (HOSPITAL_COMMUNITY): Payer: Self-pay | Admitting: *Deleted

## 2022-01-24 NOTE — Telephone Encounter (Signed)
Hospital Discharge Follow-Up Call: ? ?Patient reports that she is well and has no concerns about her healing process.  EPDS not done as patient said baby was getting fussy and she requests a call tomorrow to complete. ? ?She says that baby is well and she has no concerns about baby's health.  She reports that baby sleeps in a bedside bassinet.  Reviewed ABCs of Safe Sleep. ?

## 2022-01-25 ENCOUNTER — Telehealth (HOSPITAL_COMMUNITY): Payer: Self-pay | Admitting: *Deleted

## 2022-01-25 NOTE — Telephone Encounter (Signed)
Mom has asked to return call today for EPDS. Scored 1. No concerns noted for mom or baby. ? ?Duffy Rhody, [place] 01-25-2022 at 2:03pm ?

## 2022-02-05 ENCOUNTER — Other Ambulatory Visit: Payer: Self-pay | Admitting: Student
